# Patient Record
Sex: Female | Born: 2005 | Race: Black or African American | Hispanic: No | Marital: Single | Smoking: Never smoker
Health system: Southern US, Community
[De-identification: ages and names within clinical notes are randomized; demographics above are authoritative.]

## PROBLEM LIST (undated history)

## (undated) DIAGNOSIS — G35 Multiple sclerosis: Secondary | ICD-10-CM

## (undated) DIAGNOSIS — G35D Multiple sclerosis, unspecified: Secondary | ICD-10-CM

## (undated) DIAGNOSIS — M419 Scoliosis, unspecified: Secondary | ICD-10-CM

## (undated) DIAGNOSIS — IMO0002 Reserved for concepts with insufficient information to code with codable children: Secondary | ICD-10-CM

## (undated) HISTORY — PX: NO PAST SURGERIES: SHX2092

## (undated) HISTORY — DX: Scoliosis, unspecified: M41.9

## (undated) HISTORY — DX: Reserved for concepts with insufficient information to code with codable children: IMO0002

---
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
## 2023-01-09 DIAGNOSIS — Z419 Encounter for procedure for purposes other than remedying health state, unspecified: Secondary | ICD-10-CM | POA: Diagnosis not present

## 2023-01-23 ENCOUNTER — Other Ambulatory Visit: Payer: Self-pay | Admitting: Internal Medicine

## 2023-01-23 DIAGNOSIS — Z0000 Encounter for general adult medical examination without abnormal findings: Secondary | ICD-10-CM | POA: Diagnosis not present

## 2023-01-23 DIAGNOSIS — G44229 Chronic tension-type headache, not intractable: Secondary | ICD-10-CM | POA: Diagnosis not present

## 2023-01-23 DIAGNOSIS — E559 Vitamin D deficiency, unspecified: Secondary | ICD-10-CM | POA: Diagnosis not present

## 2023-01-23 DIAGNOSIS — Z13220 Encounter for screening for lipoid disorders: Secondary | ICD-10-CM | POA: Diagnosis not present

## 2023-01-23 DIAGNOSIS — M25552 Pain in left hip: Secondary | ICD-10-CM | POA: Diagnosis not present

## 2023-01-23 DIAGNOSIS — E669 Obesity, unspecified: Secondary | ICD-10-CM | POA: Diagnosis not present

## 2023-01-23 DIAGNOSIS — G379 Demyelinating disease of central nervous system, unspecified: Secondary | ICD-10-CM | POA: Diagnosis not present

## 2023-01-23 DIAGNOSIS — Z131 Encounter for screening for diabetes mellitus: Secondary | ICD-10-CM | POA: Diagnosis not present

## 2023-01-23 DIAGNOSIS — Z6841 Body Mass Index (BMI) 40.0 and over, adult: Secondary | ICD-10-CM | POA: Diagnosis not present

## 2023-01-23 DIAGNOSIS — H6123 Impacted cerumen, bilateral: Secondary | ICD-10-CM | POA: Diagnosis not present

## 2023-01-26 LAB — COMPLETE METABOLIC PANEL WITH GFR
AG Ratio: 1.3 (calc) (ref 1.0–2.5)
ALT: 13 U/L (ref 5–32)
AST: 17 U/L (ref 12–32)
Albumin: 4.3 g/dL (ref 3.6–5.1)
Alkaline phosphatase (APISO): 56 U/L (ref 36–128)
BUN: 8 mg/dL (ref 7–20)
CO2: 16 mmol/L — ABNORMAL LOW (ref 20–32)
Calcium: 9.8 mg/dL (ref 8.9–10.4)
Chloride: 107 mmol/L (ref 98–110)
Creat: 0.65 mg/dL (ref 0.50–0.96)
Globulin: 3.2 g/dL (calc) (ref 2.0–3.8)
Glucose, Bld: 84 mg/dL (ref 65–99)
Potassium: 4.2 mmol/L (ref 3.8–5.1)
Sodium: 139 mmol/L (ref 135–146)
Total Bilirubin: 0.6 mg/dL (ref 0.2–1.1)
Total Protein: 7.5 g/dL (ref 6.3–8.2)
eGFR: 130 mL/min/{1.73_m2} (ref >=60)

## 2023-01-26 LAB — LIPID PANEL
Cholesterol: 188 mg/dL — ABNORMAL HIGH (ref <170)
HDL: 49 mg/dL (ref >45)
LDL Cholesterol (Calc): 122 mg/dL (calc) — ABNORMAL HIGH (ref <110)
Non-HDL Cholesterol (Calc): 139 mg/dL (calc) — ABNORMAL HIGH (ref <120)
Total CHOL/HDL Ratio: 3.8 (calc) (ref <5.0)
Triglycerides: 78 mg/dL (ref <90)

## 2023-01-26 LAB — CBC
HCT: 36 % (ref 35.0–45.0)
Hemoglobin: 11.6 g/dL — ABNORMAL LOW (ref 11.7–15.5)
MCH: 24.8 pg — ABNORMAL LOW (ref 27.0–33.0)
MCHC: 32.2 g/dL (ref 32.0–36.0)
MCV: 76.9 fL — ABNORMAL LOW (ref 80.0–100.0)
MPV: 10.2 fL (ref 7.5–12.5)
Platelets: 401 10*3/uL — ABNORMAL HIGH (ref 140–400)
RBC: 4.68 10*6/uL (ref 3.80–5.10)
RDW: 15.4 % — ABNORMAL HIGH (ref 11.0–15.0)
WBC: 8.8 10*3/uL (ref 3.8–10.8)

## 2023-01-26 LAB — FOLATE: Folate: 15.8 ng/mL

## 2023-01-26 LAB — TSH: TSH: 1.38 mIU/L

## 2023-01-26 LAB — URIC ACID: Uric Acid, Serum: 3.9 mg/dL (ref 2.5–7.0)

## 2023-01-26 LAB — VITAMIN B12: Vitamin B-12: 633 pg/mL (ref 200–1100)

## 2023-01-26 LAB — ANTI-NUCLEAR AB-TITER (ANA TITER)
ANA TITER: 1:40 {titer} — ABNORMAL HIGH
ANA Titer 1: 1:40 {titer} — ABNORMAL HIGH

## 2023-01-26 LAB — SEDIMENTATION RATE: Sed Rate: 31 mm/h — ABNORMAL HIGH (ref 0–20)

## 2023-01-26 LAB — RHEUMATOID FACTOR: Rheumatoid fact SerPl-aCnc: <10 IU/mL (ref <14)

## 2023-01-26 LAB — VITAMIN D 25 HYDROXY (VIT D DEFICIENCY, FRACTURES): Vit D, 25-Hydroxy: 62 ng/mL (ref 30–100)

## 2023-01-26 LAB — ANA: Anti Nuclear Antibody (ANA): POSITIVE — AB

## 2023-01-28 DIAGNOSIS — M25552 Pain in left hip: Secondary | ICD-10-CM | POA: Diagnosis not present

## 2023-02-06 DIAGNOSIS — Z7189 Other specified counseling: Secondary | ICD-10-CM | POA: Diagnosis not present

## 2023-02-06 DIAGNOSIS — G44209 Tension-type headache, unspecified, not intractable: Secondary | ICD-10-CM | POA: Diagnosis not present

## 2023-02-06 DIAGNOSIS — M25552 Pain in left hip: Secondary | ICD-10-CM | POA: Diagnosis not present

## 2023-02-06 DIAGNOSIS — G44229 Chronic tension-type headache, not intractable: Secondary | ICD-10-CM | POA: Diagnosis not present

## 2023-02-06 DIAGNOSIS — Z6839 Body mass index (BMI) 39.0-39.9, adult: Secondary | ICD-10-CM | POA: Diagnosis not present

## 2023-02-09 DIAGNOSIS — Z419 Encounter for procedure for purposes other than remedying health state, unspecified: Secondary | ICD-10-CM | POA: Diagnosis not present

## 2023-02-11 ENCOUNTER — Telehealth: Payer: Self-pay | Admitting: Neurology

## 2023-02-11 NOTE — Telephone Encounter (Signed)
[employer] medicaid Berkley Harvey: [id number] exp. 02/05/2023-04/06/2023 sent to GI [phone]

## 2023-03-05 ENCOUNTER — Encounter: Payer: Self-pay | Admitting: Neurology

## 2023-03-05 ENCOUNTER — Ambulatory Visit (INDEPENDENT_AMBULATORY_CARE_PROVIDER_SITE_OTHER): Payer: Medicaid Other | Admitting: Neurology

## 2023-03-05 VITALS — BP 104/61 | HR 98 | Ht 66.0 in | Wt 237.5 lb

## 2023-03-05 DIAGNOSIS — G379 Demyelinating disease of central nervous system, unspecified: Secondary | ICD-10-CM

## 2023-03-05 DIAGNOSIS — M25552 Pain in left hip: Secondary | ICD-10-CM | POA: Diagnosis not present

## 2023-03-05 DIAGNOSIS — Z6838 Body mass index (BMI) 38.0-38.9, adult: Secondary | ICD-10-CM

## 2023-03-05 DIAGNOSIS — M2550 Pain in unspecified joint: Secondary | ICD-10-CM

## 2023-03-05 DIAGNOSIS — M25551 Pain in right hip: Secondary | ICD-10-CM

## 2023-03-05 MED ORDER — MELOXICAM 7.5 MG PO TABS: 7.5000 mg | ORAL_TABLET | Freq: Every day | ORAL | 11 refills | Status: DC

## 2023-03-05 NOTE — Progress Notes (Signed)
[facility]  PATIENT: Linda Floyd DOB: Jul 03, 2006  REFERRING DOCTOR OR PCP: Bing Neighbors, MD SOURCE: Patient, notes from recent hospitalization, imaging and lab reports, MRI images personally reviewed.  _________________________________   HISTORICAL  CHIEF COMPLAINT:  Chief Complaint  Patient presents with   Room 11    Pt is here Alone. Pt states that things have been going good since last appointment. Pt states that she has been losing weight since last appointment. Pt states that she still has the headaches since last appointment. Pt states that she is having joint pain.     HISTORY OF PRESENT ILLNESS:  Linda Floyd is a 17 y.o. woman with a single demyelinating event  UPDATE 03/05/2023: Denies any additional demyelinating events since her symptoms in early February.  We discussed that the workup including spinal fluid did not show evidence of MS.  However, because of the appearance of the MS lesion (consistent with MS) I feel we need to do some follow-up MRIs.  I discussed this with her in further detail.  Gait is usually fine.  She denies problems with balance though pain in the left greater than right hip is bothersome. Bowel function is fine.  She has urinary urgency at times during the night but not the day.  Vision is fine.    She is otherwise healthy though experiences back pain, joint pain (knees hips and shoulders) and headaches.   Xray of shouldrs and pelvis looked good.   ENA was negative in February 2024.  However, ANA was low positive in May 2024 (both a cytoplasmic and nuclear pattern, both 1: 40).  ESR was mildly elevated.  Mood is fine.   Marland Kitchen  HA's are not associated with N/V.  She sometimes has mild photophobia/phonophobia.   Movements intensify the pain.   Excedrin Migraine has not helped.      Zonisamide has helped  She has lost 25 pounds on phentermine (and zonisamide may also help).   History of demyelinating event She had the onset of a  left leg pulsating sensation while in bed.  When she woke up the left calf and foot were numb.    She wet to the ED and had imaging studies showing a 17 mm focus in the periventricular white matter on the left with some enhancement.    She was placed on IV steroids and initially symptoms worsened - moving to the right as well from the calf down and from the thigh down on the left.   She also has had more muscle spasms in her back.   A few days after discharge, early March 2024, her symptoms resolved.      She has no definite optic neuritis though noted some blurry vision for a few hours.     She noted some clumsiness at work for a few days a week after discharge.     Imaging: MRI of the brain 11/04/2022 shows a single periventricular focus in the left frontal lobe measuring 17 mm in longest diameter (AP).  It had heterogenous enhancement.  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  MRI of the cervical and thoracic spine 11/05/2022 showed no evidence of demyelinating disease in the spinal cord.  Prominent lymph nodes were noted left greater than right  MRI lumbar spine 11/04/2022 was normal   Laboratory: February 2024: CSF showed elevated IgG index but there was 0 oligoclonal bands.  Meningitis/encephalitis panel was negative.   HIV, hCG, ENA, ACE  were negative or noncontributory. Anti-NMO was negative.  Anti-MOG is negative.   ANCA negative  REVIEW OF SYSTEMS: Constitutional: No fevers, chills, sweats, or change in appetite Eyes: No visual changes, double vision, eye pain Ear, nose and throat: No hearing loss, ear pain, nasal congestion, sore throat Cardiovascular: No chest pain, palpitations Respiratory:  No shortness of breath at rest or with exertion.   No wheezes GastrointestinaI: No nausea, vomiting, diarrhea, abdominal pain, fecal incontinence Genitourinary:  No dysuria, urinary retention or frequency.  No nocturia. Musculoskeletal:  No neck pain, back pain Integumentary:  No rash, pruritus, skin lesions Neurological: as above Psychiatric: No depression at this time.  No anxiety Endocrine: No palpitations, diaphoresis, change in appetite, change in weigh or increased thirst Hematologic/Lymphatic:  No anemia, purpura, petechiae. Allergic/Immunologic: No itchy/runny eyes, nasal congestion, recent allergic reactions, rashes  ALLERGIES: No Known Allergies  HOME MEDICATIONS:  Current Outpatient Medications:    meloxicam (MOBIC) 7.5 MG tablet, Take 1 tablet (7.5 mg total) by mouth daily., Disp: 30 tablet, Rfl: 11   zonisamide (ZONEGRAN) 100 MG capsule, Take 1 capsule (100 mg total) by mouth daily., Disp: 30 capsule, Rfl: 5  PAST MEDICAL HISTORY: Past Medical History:  Diagnosis Date   Scoliosis     PAST SURGICAL HISTORY: Past Surgical History:  Procedure Laterality Date   NO PAST SURGERIES      FAMILY HISTORY: Family History  Problem Relation Age of Onset   Anemia Mother    Diabetes Mother    Mental illness Mother    ADD / ADHD Sister    Anemia Maternal Grandmother    Cancer Other        cancers on fathers side-type unknown   Diabetes Maternal Great-grandmother     SOCIAL HISTORY: Social History   Socioeconomic History   Marital status: Single    Spouse name: Not on file   Number of children: 0   Years of education: Not on file   Highest education level: Not on file  Occupational History   Not on file  Tobacco Use   Smoking status: Never   Smokeless tobacco: Never  Vaping Use   Vaping Use: Never used  Substance and Sexual Activity   Alcohol use: Never   Drug use: Never   Sexual activity: Never    Birth control/protection: Abstinence  Other Topics Concern   Not on file  Social History Narrative   Right Handed   Social Determinants of Health   Financial Resource Strain: Low Risk  (11/09/2022)   Overall Financial Resource Strain (CARDIA)    Difficulty of Paying Living Expenses: Not very hard  Food Insecurity: No Food  Insecurity (11/09/2022)   Hunger Vital Sign    Worried About Running Out of Food in the Last Year: Never true    Ran Out of Food in the Last Year: Never true  Transportation Needs: No Transportation Needs (11/09/2022)   PRAPARE - Administrator, Civil Service (Medical): No    Lack of Transportation (Non-Medical): No  Physical Activity: Not on file  Stress: Not on file  Social Connections: Not on file  Intimate Partner Violence: Not At Risk (11/09/2022)   Humiliation, Afraid, Rape, and Kick questionnaire    Fear of Current or Ex-Partner: No    Emotionally Abused: No    Physically Abused: No    Sexually Abused: No       PHYSICAL EXAM  Vitals:   03/05/23 1049  BP: 104/61  Pulse: 98  Weight:  237 lb 8 oz (107.7 kg)  Height: 5\' 6"  (1.676 m)     Body mass index is 38.33 kg/m.   General: The patient is well-developed and well-nourished and in no acute distress  HEENT:  Head is [place]/AT.  Sclera are anicteric.     Neck: No carotid bruits are noted.  Good range of motion.  Skin: Extremities are without rash or  edema.  Musculoskeletal:  Back is nontender  Neurologic Exam  Mental status: The patient is alert and oriented x 3 at the time of the examination. The patient has apparent normal recent and remote memory, with an apparently normal attention span and concentration ability.   Speech is normal.  Cranial nerves: Extraocular movements are full. Pupils are equal, round, and reactive to light and accomodation.  Facial strength and sensation was normal.  No dysarthria.. No obvious hearing deficits are noted.  Motor:  Muscle bulk is normal.   Tone is normal. Strength is  5 / 5 in all 4 extremities.   Sensory: Sensory testing is intact to pinprick, soft touch and vibration sensation in all 4 extremities.  Coordination: Cerebellar testing reveals good finger-nose-finger and heel-to-shin bilaterally.  Gait and station: Station is normal.   Gait is normal. Tandem gait is  normal. Romberg is negative.   Reflexes: Deep tendon reflexes are symmetric and normal bilaterally.   Plantar responses are flexor.    DIAGNOSTIC DATA (LABS, IMAGING, TESTING) - I reviewed patient records, labs, notes, testing and imaging myself where available.  Lab Results  Component Value Date   WBC 8.8 01/23/2023   HGB 11.6 (L) 01/23/2023   HCT 36.0 01/23/2023   MCV 76.9 (L) 01/23/2023   PLT 401 (H) 01/23/2023      Component Value Date/Time   NA 139 01/23/2023 0000   K 4.2 01/23/2023 0000   CL 107 01/23/2023 0000   CO2 16 (L) 01/23/2023 0000   GLUCOSE 84 01/23/2023 0000   BUN 8 01/23/2023 0000   CREATININE 0.65 01/23/2023 0000   CALCIUM 9.8 01/23/2023 0000   PROT 7.5 01/23/2023 0000   ALBUMIN 4.2 11/06/2022 1442   AST 17 01/23/2023 0000   ALT 13 01/23/2023 0000   BILITOT 0.6 01/23/2023 0000   GFRNONAA >60 11/07/2022 0722   Lab Results  Component Value Date   CHOL 188 (H) 01/23/2023   HDL 49 01/23/2023   LDLCALC 122 (H) 01/23/2023   TRIG 78 01/23/2023   CHOLHDL 3.8 01/23/2023   Lab Results  Component Value Date   HGBA1C 5.5 04/05/2022   Lab Results  Component Value Date   VITAMINB12 633 01/23/2023   Lab Results  Component Value Date   TSH 1.38 01/23/2023       ASSESSMENT AND PLAN  Demyelinating changes in brain [facility]) - Plan: MR BRAIN W WO CONTRAST  Polyarthralgia  Bilateral hip pain  BMI 38.0-38.9,adult   MRI brain to determine if breakthrough activity is occurring.  If so, consider a DMT Continue zonisamide for migraine Meloxicam 7.5 mg for joint pain She as joint pain and ANA was low positive (cytoplasmic).   If symptoms worsen, despite meloxicam, advosed to discuss further with PCP - may need rheum referral.   Rtc 13 months   Korynn Kenedy A. Epimenio Foot, MD, [facility] 03/05/2023, 11:16 AM Certified in Neurology, Clinical Neurophysiology, Sleep Medicine and Neuroimaging  [facility] [address], [address] [place], [place]  [address] [phone]

## 2023-03-11 DIAGNOSIS — Z419 Encounter for procedure for purposes other than remedying health state, unspecified: Secondary | ICD-10-CM | POA: Diagnosis not present

## 2023-04-03 DIAGNOSIS — Z6836 Body mass index (BMI) 36.0-36.9, adult: Secondary | ICD-10-CM | POA: Diagnosis not present

## 2023-04-03 DIAGNOSIS — H6121 Impacted cerumen, right ear: Secondary | ICD-10-CM | POA: Diagnosis not present

## 2023-04-03 DIAGNOSIS — Z7189 Other specified counseling: Secondary | ICD-10-CM | POA: Diagnosis not present

## 2023-04-03 DIAGNOSIS — G379 Demyelinating disease of central nervous system, unspecified: Secondary | ICD-10-CM | POA: Diagnosis not present

## 2023-04-03 DIAGNOSIS — M25552 Pain in left hip: Secondary | ICD-10-CM | POA: Diagnosis not present

## 2023-04-03 DIAGNOSIS — F4323 Adjustment disorder with mixed anxiety and depressed mood: Secondary | ICD-10-CM | POA: Diagnosis not present

## 2023-04-03 DIAGNOSIS — L700 Acne vulgaris: Secondary | ICD-10-CM | POA: Diagnosis not present

## 2023-04-09 NOTE — Telephone Encounter (Signed)
[employer] medicaid Berkley Harvey: [id number] exp. 04/08/23-06/07/23 sent to GI [phone]

## 2023-04-11 DIAGNOSIS — Z419 Encounter for procedure for purposes other than remedying health state, unspecified: Secondary | ICD-10-CM | POA: Diagnosis not present

## 2023-05-12 DIAGNOSIS — Z419 Encounter for procedure for purposes other than remedying health state, unspecified: Secondary | ICD-10-CM | POA: Diagnosis not present

## 2023-05-16 DIAGNOSIS — Z7189 Other specified counseling: Secondary | ICD-10-CM | POA: Diagnosis not present

## 2023-05-16 DIAGNOSIS — G379 Demyelinating disease of central nervous system, unspecified: Secondary | ICD-10-CM | POA: Diagnosis not present

## 2023-05-16 DIAGNOSIS — N925 Other specified irregular menstruation: Secondary | ICD-10-CM | POA: Diagnosis not present

## 2023-05-16 DIAGNOSIS — G44229 Chronic tension-type headache, not intractable: Secondary | ICD-10-CM | POA: Diagnosis not present

## 2023-05-16 DIAGNOSIS — Z6834 Body mass index (BMI) 34.0-34.9, adult: Secondary | ICD-10-CM | POA: Diagnosis not present

## 2023-05-20 DIAGNOSIS — L700 Acne vulgaris: Secondary | ICD-10-CM | POA: Diagnosis not present

## 2023-05-20 DIAGNOSIS — L83 Acanthosis nigricans: Secondary | ICD-10-CM | POA: Diagnosis not present

## 2023-05-20 DIAGNOSIS — L819 Disorder of pigmentation, unspecified: Secondary | ICD-10-CM | POA: Diagnosis not present

## 2023-05-21 DIAGNOSIS — N926 Irregular menstruation, unspecified: Secondary | ICD-10-CM | POA: Diagnosis not present

## 2023-05-22 ENCOUNTER — Ambulatory Visit (INDEPENDENT_AMBULATORY_CARE_PROVIDER_SITE_OTHER): Payer: Self-pay | Admitting: Primary Care

## 2023-05-29 DIAGNOSIS — F411 Generalized anxiety disorder: Secondary | ICD-10-CM | POA: Diagnosis not present

## 2023-05-29 DIAGNOSIS — F331 Major depressive disorder, recurrent, moderate: Secondary | ICD-10-CM | POA: Diagnosis not present

## 2023-06-05 NOTE — Telephone Encounter (Signed)
[employer] medicaid Berkley Harvey: [id number] exp. 06/05/23-08/04/23

## 2023-06-09 ENCOUNTER — Ambulatory Visit
Admission: RE | Admit: 2023-06-09 | Discharge: 2023-06-09 | Disposition: A | Discharge status: Home / Self Care | Payer: Medicaid Other | Source: Ambulatory Visit | Attending: Neurology | Admitting: Neurology

## 2023-06-09 DIAGNOSIS — G379 Demyelinating disease of central nervous system, unspecified: Secondary | ICD-10-CM

## 2023-06-09 MED ORDER — GADOPICLENOL 0.5 MMOL/ML IV SOLN
10.0000 mL | Freq: Once | INTRAVENOUS | Status: AC | PRN
  Administered 2023-06-09: 10 mL via INTRAVENOUS

## 2023-06-10 ENCOUNTER — Telehealth: Payer: Self-pay | Admitting: Neurology

## 2023-06-10 NOTE — Telephone Encounter (Signed)
I spoke to Epiphany about the MRI findings.  The MRI shows that the enhancement of the left frontal lesion has resolved.  However, since the February 2024 MRI there are about 3 new lesions.  They are all fairly small and would probably have been asymptomatic.  However, this change over time makes the diagnosis of MS more likely and I would recommend that we begin a disease modifying therapy.  I would like her to come into the office in the next week or 2 so that we can discuss the DMT options and get her started.

## 2023-06-11 DIAGNOSIS — Z419 Encounter for procedure for purposes other than remedying health state, unspecified: Secondary | ICD-10-CM | POA: Diagnosis not present

## 2023-06-11 NOTE — Telephone Encounter (Signed)
Spoke w/ Dr. Epimenio Foot. Ok to use MSNP slot next week

## 2023-06-11 NOTE — Telephone Encounter (Signed)
Called pt. Scheduled appt for 06/20/23 at 1:30pm, check in 1:00pm. Pt verbalized understanding and appreciation.

## 2023-06-18 DIAGNOSIS — F331 Major depressive disorder, recurrent, moderate: Secondary | ICD-10-CM | POA: Diagnosis not present

## 2023-06-18 DIAGNOSIS — F411 Generalized anxiety disorder: Secondary | ICD-10-CM | POA: Diagnosis not present

## 2023-06-20 ENCOUNTER — Telehealth: Payer: Self-pay | Admitting: *Deleted

## 2023-06-20 ENCOUNTER — Encounter: Payer: Self-pay | Admitting: Neurology

## 2023-06-20 ENCOUNTER — Ambulatory Visit (INDEPENDENT_AMBULATORY_CARE_PROVIDER_SITE_OTHER): Payer: Medicaid Other | Admitting: Neurology

## 2023-06-20 ENCOUNTER — Ambulatory Visit: Payer: Medicaid Other | Admitting: Neurology

## 2023-06-20 VITALS — BP 112/73 | HR 91 | Ht 66.0 in | Wt 208.0 lb

## 2023-06-20 DIAGNOSIS — G35 Multiple sclerosis: Secondary | ICD-10-CM | POA: Diagnosis not present

## 2023-06-20 DIAGNOSIS — G379 Demyelinating disease of central nervous system, unspecified: Secondary | ICD-10-CM | POA: Diagnosis not present

## 2023-06-20 DIAGNOSIS — R200 Anesthesia of skin: Secondary | ICD-10-CM

## 2023-06-20 DIAGNOSIS — N925 Other specified irregular menstruation: Secondary | ICD-10-CM | POA: Diagnosis not present

## 2023-06-20 DIAGNOSIS — G44229 Chronic tension-type headache, not intractable: Secondary | ICD-10-CM | POA: Diagnosis not present

## 2023-06-20 DIAGNOSIS — M2550 Pain in unspecified joint: Secondary | ICD-10-CM | POA: Diagnosis not present

## 2023-06-20 DIAGNOSIS — E559 Vitamin D deficiency, unspecified: Secondary | ICD-10-CM

## 2023-06-20 MED ORDER — ESCITALOPRAM OXALATE 10 MG PO TABS: 10.0000 mg | ORAL_TABLET | Freq: Every day | ORAL | 3 refills | Status: DC

## 2023-06-20 MED ORDER — ZONISAMIDE 100 MG PO CAPS: 100.0000 mg | ORAL_CAPSULE | Freq: Every day | ORAL | 3 refills | Status: DC

## 2023-06-20 NOTE — Progress Notes (Signed)
[facility]  PATIENT: Arletha Marschke DOB: 2006/03/25  REFERRING DOCTOR OR PCP: Bing Neighbors, MD SOURCE: Patient, notes from recent hospitalization, imaging and lab reports, MRI images personally reviewed.  _________________________________   HISTORICAL  CHIEF COMPLAINT:  Chief Complaint  Patient presents with   Room 10    Pt is here Alone. Pt states that she is having tremors. Pt states that she would like to discuss something for anxiety. Pt is being active and working out.     HISTORY OF PRESENT ILLNESS:  Briann Sarchet is a 17 y.o. woman with newly diagnosed RRMS.  UPDATE 06/19/2023: Since last visit, an MRI of the brain was performed 06/09/2023.  It showed resolution of the enhancement associated with the left frontal focus seen on the previous MRI.  However, in the interim (11/04/2022) there are 3 additional foci in the hemispheres.  These are small and would probably not have cause any symptoms.  However, the changes from the first to the second MRI show dissemination in time consistent with multiple sclerosis.  Therefore, I would like to get her started on a disease modifying therapy.  We had a discussion about therapy options and discussed pros and cons of different treatment as well as affect and family planning.  For those reasons, she was most interested in Copaxone/glatiramer.  She has not had any exacerbations since her symptoms in early February.  We discussed that the workup including spinal fluid did not show evidence of MS.  However, because of the appearance of the MS lesion (consistent with MS) I feel we need to do some follow-up MRIs.  I discussed this with her in further detail.  Gait is fine.  She can go up and down stairs without the banister.. Bowel function is fine.  She has urinary urgency at times during the night but not the day.  Vision is fine.    She is otherwise healthy though experiences back pain, joint pain (knees hips and shoulders) and  headaches.   Xray of shouldrs and pelvis looked good.   ENA was negative in February 2024.  However, ANA was low positive in May 2024 (both a cytoplasmic and nuclear pattern, both 1: 40).  ESR was mildly elevated.  Since the diagnosis, she has noted quite a bit more anxiety.  She denies actual depression..   Lodema Pilot are not associated with N/V.  She sometimes has mild photophobia/phonophobia.   Movements intensify the pain.   Excedrin Migraine has not helped.      Zonisamide helped but she stopped when the prescription ran out and has not refilled.  She has lost 25 pounds on phentermine (and zonisamide may also help).   History of demyelinating event She had the onset of a left leg pulsating sensation while in bed.  When she woke up the left calf and foot were numb.    She wet to the ED and had imaging studies showing a 17 mm focus in the periventricular white matter on the left with some enhancement.    She was placed on IV steroids and initially symptoms worsened - moving to the right as well from the calf down and from the thigh down on the left.   She also has had more muscle spasms in her back.   A few days after discharge, early March 2024, her symptoms resolved.      She has no definite optic neuritis though noted some blurry vision for a few hours.  She noted some clumsiness at work for a few days a week after discharge.     Imaging: MRI of the brain 11/04/2022 shows a single periventricular focus in the left frontal lobe measuring 17 mm in longest diameter (AP).  It had heterogenous enhancement.  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  MRI of the cervical and thoracic spine 11/05/2022 showed no evidence of demyelinating disease in the spinal cord.  Prominent lymph nodes were noted left greater than right  MRI lumbar spine 11/04/2022 was normal   Laboratory: February 2024: CSF showed elevated IgG index but there was 0 oligoclonal bands.  Meningitis/encephalitis panel  was negative.   HIV, hCG, ENA, ACE were negative or noncontributory. Anti-NMO was negative.  Anti-MOG is negative.   ANCA negative  REVIEW OF SYSTEMS: Constitutional: No fevers, chills, sweats, or change in appetite Eyes: No visual changes, double vision, eye pain Ear, nose and throat: No hearing loss, ear pain, nasal congestion, sore throat Cardiovascular: No chest pain, palpitations Respiratory:  No shortness of breath at rest or with exertion.   No wheezes GastrointestinaI: No nausea, vomiting, diarrhea, abdominal pain, fecal incontinence Genitourinary:  No dysuria, urinary retention or frequency.  No nocturia. Musculoskeletal:  No neck pain, back pain Integumentary: No rash, pruritus, skin lesions Neurological: as above Psychiatric: No depression at this time.  No anxiety Endocrine: No palpitations, diaphoresis, change in appetite, change in weigh or increased thirst Hematologic/Lymphatic:  No anemia, purpura, petechiae. Allergic/Immunologic: No itchy/runny eyes, nasal congestion, recent allergic reactions, rashes  ALLERGIES: No Known Allergies  HOME MEDICATIONS:  Current Outpatient Medications:    escitalopram (LEXAPRO) 10 MG tablet, Take 1 tablet (10 mg total) by mouth daily., Disp: 90 tablet, Rfl: 3   meloxicam (MOBIC) 7.5 MG tablet, Take 1 tablet (7.5 mg total) by mouth daily., Disp: 30 tablet, Rfl: 11   zonisamide (ZONEGRAN) 100 MG capsule, Take 1 capsule (100 mg total) by mouth daily., Disp: 90 capsule, Rfl: 3  PAST MEDICAL HISTORY: Past Medical History:  Diagnosis Date   Scoliosis     PAST SURGICAL HISTORY: Past Surgical History:  Procedure Laterality Date   NO PAST SURGERIES      FAMILY HISTORY: Family History  Problem Relation Age of Onset   Anemia Mother    Diabetes Mother    Mental illness Mother    ADD / ADHD Sister    Anemia Maternal Grandmother    Cancer Other        cancers on fathers side-type unknown   Diabetes Maternal Great-grandmother      SOCIAL HISTORY: Social History   Socioeconomic History   Marital status: Single    Spouse name: Not on file   Number of children: 0   Years of education: Not on file   Highest education level: Not on file  Occupational History   Not on file  Tobacco Use   Smoking status: Never   Smokeless tobacco: Never  Vaping Use   Vaping status: Never Used  Substance and Sexual Activity   Alcohol use: Never   Drug use: Never   Sexual activity: Never    Birth control/protection: Abstinence  Other Topics Concern   Not on file  Social History Narrative   Right Handed   Social Determinants of Health   Financial Resource Strain: Low Risk  (11/09/2022)   Overall Financial Resource Strain (CARDIA)    Difficulty of Paying Living Expenses: Not very hard  Food Insecurity: No Food Insecurity (11/09/2022)  Hunger Vital Sign    Worried About Running Out of Food in the Last Year: Never true    Ran Out of Food in the Last Year: Never true  Transportation Needs: No Transportation Needs (11/09/2022)   PRAPARE - Administrator, Civil Service (Medical): No    Lack of Transportation (Non-Medical): No  Physical Activity: Not on file  Stress: Not on file  Social Connections: Not on file  Intimate Partner Violence: Not At Risk (11/09/2022)   Humiliation, Afraid, Rape, and Kick questionnaire    Fear of Current or Ex-Partner: No    Emotionally Abused: No    Physically Abused: No    Sexually Abused: No       PHYSICAL EXAM  Vitals:   06/20/23 0827  BP: 112/73  Pulse: 91  Weight: 208 lb (94.3 kg)  Height: 5\' 6"  (1.676 m)     Body mass index is 33.57 kg/m.   General: The patient is well-developed and well-nourished and in no acute distress  HEENT:  Head is [place]/AT.  Sclera are anicteric.     Neck: No carotid bruits are noted.  Good range of motion.  Skin: Extremities are without rash or  edema.  Musculoskeletal:  Back is nontender  Neurologic Exam  Mental status: The  patient is alert and oriented x 3 at the time of the examination. The patient has apparent normal recent and remote memory, with an apparently normal attention span and concentration ability.   Speech is normal.  Cranial nerves: Extraocular movements are full. Pupils are equal, round, and reactive to light and accomodation.  Facial strength and sensation was normal.  No dysarthria.. No obvious hearing deficits are noted.  Motor:  Muscle bulk is normal.   Tone is normal. Strength is  5 / 5 in all 4 extremities.   Sensory: Sensory testing is intact to pinprick, soft touch and vibration sensation in all 4 extremities.  Coordination: Cerebellar testing reveals good finger-nose-finger and heel-to-shin bilaterally.  Gait and station: Station is normal.   The gait and tandem gait are normal.. Romberg is negative.   Reflexes: Deep tendon reflexes are symmetric and normal bilaterally.   Plantar responses are flexor.    DIAGNOSTIC DATA (LABS, IMAGING, TESTING) - I reviewed patient records, labs, notes, testing and imaging myself where available.  Lab Results  Component Value Date   WBC 8.8 01/23/2023   HGB 11.6 (L) 01/23/2023   HCT 36.0 01/23/2023   MCV 76.9 (L) 01/23/2023   PLT 401 (H) 01/23/2023      Component Value Date/Time   NA 139 01/23/2023 0000   K 4.2 01/23/2023 0000   CL 107 01/23/2023 0000   CO2 16 (L) 01/23/2023 0000   GLUCOSE 84 01/23/2023 0000   BUN 8 01/23/2023 0000   CREATININE 0.65 01/23/2023 0000   CALCIUM 9.8 01/23/2023 0000   PROT 7.5 01/23/2023 0000   ALBUMIN 4.2 11/06/2022 1442   AST 17 01/23/2023 0000   ALT 13 01/23/2023 0000   BILITOT 0.6 01/23/2023 0000   GFRNONAA >60 11/07/2022 0722   Lab Results  Component Value Date   CHOL 188 (H) 01/23/2023   HDL 49 01/23/2023   LDLCALC 122 (H) 01/23/2023   TRIG 78 01/23/2023   CHOLHDL 3.8 01/23/2023   Lab Results  Component Value Date   HGBA1C 5.5 04/05/2022   Lab Results  Component Value Date   VITAMINB12  633 01/23/2023   Lab Results  Component Value Date   TSH  1.38 01/23/2023       ASSESSMENT AND PLAN  Multiple sclerosis (HCC)  Polyarthralgia  Numbness  Chronic tension-type headache, not intractable  Vitamin D deficiency   The MRI of the brain showed additional white matter foci.  She now meets McDonald criteria for multiple sclerosis.  Therefore, beginning a disease modifying therapy is appropriate.  We went over options.  For potential future family-planning issues, she was most interested in glatiramer.  She signed the service request form and we will try to get her started.  We went over the risks and benefits.  We also discussed that a backup plan would be dimethyl fumarate if she has significant skin reactions.  Sometime in 2025 we will check an MRI of the brain.  If she has had further progression we will need to consider a more efficacious disease modifying therapy.   Restart zonisamide for migraine Meloxicam 7.5 mg for joint pain.   She as joint pain and ANA was low positive (cytoplasmic).   If symptoms worsen, despite meloxicam, advosed to discuss further with PCP - may need rheum referral.   S-Citalopram 10 mg for anxiety. Rtc 6 months   Marlo Goodrich A. Epimenio Foot, MD, [facility] 06/20/2023, 9:32 AM Certified in Neurology, Clinical Neurophysiology, Sleep Medicine and Neuroimaging  [facility] [address], [address] [place], [place] [address] [phone]

## 2023-06-20 NOTE — Telephone Encounter (Signed)
  Faxed to [phone] confirmation received.

## 2023-07-02 NOTE — Telephone Encounter (Signed)
Attempted PA for glatiramer 40mg  via [facility]. Sent to [facility]. Key: [id number]. Should have a determination within 3-5 business days.

## 2023-07-04 NOTE — Telephone Encounter (Signed)
I called patient to inform her that the glatiramer injection does not need a PA.  She should have received calls from [employer] already.  No answer, left a voicemail asking her to call us back.  If patient calls back, please advise her that her glatiramer did not need prior authorization through her insurance.  She should have received calls from [employer] already.  Please ask her if she has received her glatiramer yet.  If not, please give her [facility]'s phone number of [phone] and encouraged her to call them.

## 2023-07-10 NOTE — Telephone Encounter (Signed)
Patient called, have not receive injection in the mail. Informed patient of previous message and relayed to her the phone number for  [employer].

## 2023-07-11 NOTE — Telephone Encounter (Signed)
Took call from phone staff and spoke with Payal from [employer]. Confirmed we have not sent rx to specialty pharmacy. Advised we have them typically handle this. They verbalized understanding and will coordinate this for pt, nothing further needed.

## 2023-07-12 DIAGNOSIS — Z419 Encounter for procedure for purposes other than remedying health state, unspecified: Secondary | ICD-10-CM | POA: Diagnosis not present

## 2023-07-22 NOTE — Telephone Encounter (Signed)
[facility] Clydie Braun) caliing check on the status PA. Sent over CoverMyMeds keys phone number to start the plan.[phone]  Can contact at [phone]

## 2023-07-22 NOTE — Telephone Encounter (Signed)
Called [facility] back. Spoke w/ pharm tech. She blind [facility]. Spoke w/ Katie.  States pt has dedicated team and transferred me. Asked to speak with pharmacy. Unable to get rep on the phone.  NO PA needed per letter below.

## 2023-07-22 NOTE — Telephone Encounter (Signed)
Called [facility] back and spoke w/ Crystal in the pharmacy. Relayed info from approval letter. [employer]DC covered is for brand name Copaxone [phone]). I provided VO to switch to this instead (DAW). Asked they also dispense Autoject 2 for glass syringe injection device. She will confirm with pharmacy to see if they can get this device. They will call back if unable to dispense this device.

## 2023-07-25 NOTE — Telephone Encounter (Signed)
I called [facility]. Copaxone has been shipped on11/13/2024.TBD 07/26/23 to patient's address.

## 2023-07-29 NOTE — Telephone Encounter (Signed)
I called patient to discuss if she has received and started glatiramer.  No answer, voicemail has not been set up yet.  I will continue to follow.

## 2023-08-01 DIAGNOSIS — Z6832 Body mass index (BMI) 32.0-32.9, adult: Secondary | ICD-10-CM | POA: Diagnosis not present

## 2023-08-01 DIAGNOSIS — Z7189 Other specified counseling: Secondary | ICD-10-CM | POA: Diagnosis not present

## 2023-08-01 DIAGNOSIS — N925 Other specified irregular menstruation: Secondary | ICD-10-CM | POA: Diagnosis not present

## 2023-08-01 DIAGNOSIS — G4700 Insomnia, unspecified: Secondary | ICD-10-CM | POA: Diagnosis not present

## 2023-08-07 ENCOUNTER — Ambulatory Visit: Payer: Medicaid Other | Admitting: Registered"

## 2023-08-10 ENCOUNTER — Encounter (HOSPITAL_COMMUNITY): Payer: Self-pay

## 2023-08-10 ENCOUNTER — Ambulatory Visit (HOSPITAL_COMMUNITY)
Admission: EM | Admit: 2023-08-10 | Discharge: 2023-08-10 | Disposition: A | Discharge status: Home / Self Care | Payer: Medicaid Other | Attending: Family Medicine | Admitting: Family Medicine

## 2023-08-10 DIAGNOSIS — R5383 Other fatigue: Secondary | ICD-10-CM | POA: Diagnosis not present

## 2023-08-10 HISTORY — DX: Multiple sclerosis, unspecified: G35.D

## 2023-08-10 HISTORY — DX: Multiple sclerosis: G35

## 2023-08-10 LAB — CBC WITH DIFFERENTIAL/PLATELET
Abs Immature Granulocytes: 0.02 10*3/uL (ref 0.00–0.07)
Basophils Absolute: 0.1 10*3/uL (ref 0.0–0.1)
Basophils Relative: 1 %
Eosinophils Absolute: 0.2 10*3/uL (ref 0.0–0.5)
Eosinophils Relative: 2 %
HCT: 35.5 % — ABNORMAL LOW (ref 36.0–46.0)
Hemoglobin: 11.6 g/dL — ABNORMAL LOW (ref 12.0–15.0)
Immature Granulocytes: 0 %
Lymphocytes Relative: 32 %
Lymphs Abs: 2.9 10*3/uL (ref 0.7–4.0)
MCH: 26.9 pg (ref 26.0–34.0)
MCHC: 32.7 g/dL (ref 30.0–36.0)
MCV: 82.4 fL (ref 80.0–100.0)
Monocytes Absolute: 0.6 10*3/uL (ref 0.1–1.0)
Monocytes Relative: 6 %
Neutro Abs: 5.4 10*3/uL (ref 1.7–7.7)
Neutrophils Relative %: 59 %
Platelets: 333 10*3/uL (ref 150–400)
RBC: 4.31 MIL/uL (ref 3.87–5.11)
RDW: 14.3 % (ref 11.5–15.5)
WBC: 9.1 10*3/uL (ref 4.0–10.5)
nRBC: 0 % (ref 0.0–0.2)

## 2023-08-10 LAB — COMPREHENSIVE METABOLIC PANEL
ALT: 22 U/L (ref 0–44)
AST: 20 U/L (ref 15–41)
Albumin: 4.1 g/dL (ref 3.5–5.0)
Alkaline Phosphatase: 43 U/L (ref 38–126)
Anion gap: 7 (ref 5–15)
BUN: 14 mg/dL (ref 6–20)
CO2: 25 mmol/L (ref 22–32)
Calcium: 9.4 mg/dL (ref 8.9–10.3)
Chloride: 104 mmol/L (ref 98–111)
Creatinine, Ser: 0.72 mg/dL (ref 0.44–1.00)
GFR, Estimated: >60 mL/min (ref >60)
Glucose, Bld: 94 mg/dL (ref 70–99)
Potassium: 3.7 mmol/L (ref 3.5–5.1)
Sodium: 136 mmol/L (ref 135–145)
Total Bilirubin: 0.7 mg/dL (ref <1.2)
Total Protein: 7.4 g/dL (ref 6.5–8.1)

## 2023-08-10 LAB — TSH: TSH: 1.116 u[IU]/mL (ref 0.350–4.500)

## 2023-08-10 LAB — LIPASE, BLOOD: Lipase: 27 U/L (ref 11–51)

## 2023-08-10 NOTE — Discharge Instructions (Signed)
You have had labs (blood work) sent today. We will call you with any significant abnormalities or if there is need to begin or change treatment or pursue further follow up.  You may also review your test results online through [facility]. If you do not have a MyChart account, instructions to sign up should be on your discharge paperwork.

## 2023-08-10 NOTE — ED Triage Notes (Signed)
Pt presents to the office for fatigue, altered mental status and yellow eyes. Pt states her symptoms started 2 weeks ago after starting a new medication called Copaxone for MS. Patient states when she wakes up in the morning her eyes are yellow.

## 2023-08-11 DIAGNOSIS — Z419 Encounter for procedure for purposes other than remedying health state, unspecified: Secondary | ICD-10-CM | POA: Diagnosis not present

## 2023-08-12 NOTE — ED Provider Notes (Signed)
[facility]   [id number] 08/10/23 Arrival Time: 1559  ASSESSMENT & PLAN:  1. Other fatigue    She requests blood work.  Pending:     CBC with Differential/Platelet   Comprehensive metabolic panel   Lipase, blood   TSH   Appears well. Will work on getting in to see her neurologist asap.    Follow-up Information     [facility].   Specialty: Urgent Care Why: As needed. Contact information: [address] [place] [address] [phone]                Reviewed expectations re: course of current medical issues. Questions answered. Outlined signs and symptoms indicating need for more acute intervention. Understanding verbalized. After Visit Summary given.   SUBJECTIVE: History from: Patient. Linda Floyd is a 17 y.o. female. Pt presents with CC of fatigue. Approx two weeks. Wondering if new medication Copaxone is contributing. Reports "yellow eyes" in the mornings after taking mediciaton. Followed by neurology for questionable MS. Denies: fever and abd pain. Normal PO intake without n/v/d. Patient's last menstrual period was 08/10/2023 (approximate).   OBJECTIVE:  Vitals:   08/10/23 1652  BP: 114/68  Pulse: 90  Resp: 18  Temp: 98.5 F (36.9 C)  TempSrc: Oral  SpO2: 97%    General appearance: alert; no distress Eyes: PERRLA; EOMI; conjunctiva normal HENT: [place]; AT Neck: supple  Lungs: speaks full sentences without difficulty; unlabored Skin: warm and dry Neurologic: normal gait Psychological: alert and cooperative; normal mood and affect  No Known Allergies  Past Medical History:  Diagnosis Date   MS (multiple sclerosis) (HCC)    Scoliosis    Social History   Socioeconomic History   Marital status: Single    Spouse name: Not on file   Number of children: 0   Years of education: Not on file   Highest education level: Not on file  Occupational History   Not on file  Tobacco Use    Smoking status: Never   Smokeless tobacco: Never  Vaping Use   Vaping status: Never Used  Substance and Sexual Activity   Alcohol use: Never   Drug use: Never   Sexual activity: Never    Birth control/protection: Abstinence  Other Topics Concern   Not on file  Social History Narrative   Right Handed   Social Determinants of Health   Financial Resource Strain: Low Risk  (11/09/2022)   Overall Financial Resource Strain (CARDIA)    Difficulty of Paying Living Expenses: Not very hard  Food Insecurity: No Food Insecurity (11/09/2022)   Hunger Vital Sign    Worried About Running Out of Food in the Last Year: Never true    Ran Out of Food in the Last Year: Never true  Transportation Needs: No Transportation Needs (11/09/2022)   PRAPARE - Administrator, Civil Service (Medical): No    Lack of Transportation (Non-Medical): No  Physical Activity: Not on file  Stress: Not on file  Social Connections: Not on file  Intimate Partner Violence: Not At Risk (11/09/2022)   Humiliation, Afraid, Rape, and Kick questionnaire    Fear of Current or Ex-Partner: No    Emotionally Abused: No    Physically Abused: No    Sexually Abused: No   Family History  Problem Relation Age of Onset   Anemia Mother    Diabetes Mother    Mental illness Mother    ADD / ADHD Sister  Anemia Maternal Grandmother    Cancer Other        cancers on fathers side-type unknown   Diabetes Maternal Great-grandmother    Past Surgical History:  Procedure Laterality Date   NO PAST SURGERIES       Mardella Layman, MD 08/12/23 [phone]

## 2023-08-15 NOTE — Telephone Encounter (Signed)
I called patient again to discuss if she has received and started glatiramer. No answer, voicemail has not been set up yet. I will try again another time.

## 2023-08-29 DIAGNOSIS — G4700 Insomnia, unspecified: Secondary | ICD-10-CM | POA: Diagnosis not present

## 2023-09-11 DIAGNOSIS — Z419 Encounter for procedure for purposes other than remedying health state, unspecified: Secondary | ICD-10-CM | POA: Diagnosis not present

## 2023-09-17 NOTE — Telephone Encounter (Signed)
 I called patient again to discuss if she has received and started Copaxone.  No answer, voicemail has not been set up yet.  She has been unresponsive to phone calls and MyChart messages.  I will mail her a letter asking her to call us back.

## 2023-10-12 DIAGNOSIS — Z419 Encounter for procedure for purposes other than remedying health state, unspecified: Secondary | ICD-10-CM | POA: Diagnosis not present

## 2023-11-09 DIAGNOSIS — Z419 Encounter for procedure for purposes other than remedying health state, unspecified: Secondary | ICD-10-CM | POA: Diagnosis not present

## 2023-12-21 DIAGNOSIS — Z419 Encounter for procedure for purposes other than remedying health state, unspecified: Secondary | ICD-10-CM | POA: Diagnosis not present

## 2023-12-24 ENCOUNTER — Encounter: Payer: Self-pay | Admitting: Neurology

## 2023-12-24 ENCOUNTER — Ambulatory Visit (INDEPENDENT_AMBULATORY_CARE_PROVIDER_SITE_OTHER): Payer: Medicaid Other | Admitting: Neurology

## 2023-12-24 VITALS — BP 109/68 | HR 85 | Ht 65.0 in | Wt 195.0 lb

## 2023-12-24 DIAGNOSIS — G35 Multiple sclerosis: Secondary | ICD-10-CM

## 2023-12-24 DIAGNOSIS — M2550 Pain in unspecified joint: Secondary | ICD-10-CM

## 2023-12-24 DIAGNOSIS — G44229 Chronic tension-type headache, not intractable: Secondary | ICD-10-CM

## 2023-12-24 DIAGNOSIS — R200 Anesthesia of skin: Secondary | ICD-10-CM

## 2023-12-24 NOTE — Progress Notes (Signed)
 [facility]  PATIENT: Linda Floyd DOB: [birth year]  REFERRING DOCTOR OR PCP: Bing Neighbors, MD SOURCE: Patient, notes from recent hospitalization, imaging and lab reports, MRI images personally reviewed.  _________________________________   HISTORICAL  CHIEF COMPLAINT:  Chief Complaint  Patient presents with   Follow-up    Pt in 10 alone Pt here MS f/u Pt states numbness,tingling,burning in whole body Pt states eyes and finger twitch Pt states increased headaches . Pt hasn't had eye appointment since elementary school Pt states forgetful     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  UPDATE 12/24/2023: She started glatiramer around 06/2023.  She is tolerating it well systemically but has skin reactions.   She notes no definite exacerbation but notes some mild cognitive issues , aches/pains and numbness.   She notes some fluctuating visual changes but n change in color vision.    The MRI of the brain performed 06/09/2023 showed resolution of the enhancement associated with the left frontal focus seen on the previous MRI.  However, in the interim (since 11/04/2022) there are 3 additional foci in the hemispheres.  These are small and would probably not have cause any symptoms.      Gait is fine.  She can go up and down stairs without the banister.. She sometimes goes to a gym.  Bowel function is fine.  She has urinary urgency but no incontinence  Vision is fine.    She still experiences back pain, joint pain (knees hips and shoulders) and headaches.   Xray of shouldrs and pelvis looked good.   ENA was negative in February 2024.  However, ANA was low positive in May 2024 (both a cytoplasmic and nuclear pattern, both 1: 40).  ESR was mildly elevated.  Since the diagnosis, she has noted quite a bit more anxiety.  She denies actual depression..   .    She has migraines.    These are not associated with N/V.  She sometimes has mild  photophobia/phonophobia.   Movements intensify the pain.   Excedrin Migraine has not helped.    She tried zonisamide and is may have helped some but did not eliminate them. .  History of demyelinating event She had the onset of a left leg pulsating sensation while in bed.  When she woke up the left calf and foot were numb.    She wet to the ED and had imaging studies showing a 17 mm focus in the periventricular white matter on the left with some enhancement.    She was placed on IV steroids and initially symptoms worsened - moving to the right as well from the calf down and from the thigh down on the left.   She also has had more muscle spasms in her back.   A few days after discharge, early March 2024, her symptoms resolved.      She has no definite optic neuritis though noted some blurry vision for a few hours.     She noted some clumsiness at work for a few days a week after discharge.     Imaging: MRI of the brain 11/04/2022 shows a single periventricular focus in the left frontal lobe measuring 17 mm in longest diameter (AP).  It had heterogenous enhancement.  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  MRI of the cervical and thoracic spine 11/05/2022 showed no evidence of demyelinating disease in the spinal cord.  Prominent  lymph nodes were noted left greater than right  MRI lumbar spine 11/04/2022 was normal   Laboratory: February 2024: CSF showed elevated IgG index but there was 0 oligoclonal bands.  Meningitis/encephalitis panel was negative.   HIV, hCG, ENA, ACE were negative or noncontributory. Anti-NMO was negative.  Anti-MOG is negative.   ANCA negative  REVIEW OF SYSTEMS: Constitutional: No fevers, chills, sweats, or change in appetite Eyes: No visual changes, double vision, eye pain Ear, nose and throat: No hearing loss, ear pain, nasal congestion, sore throat Cardiovascular: No chest pain, palpitations Respiratory:  No shortness of breath at rest or with  exertion.   No wheezes GastrointestinaI: No nausea, vomiting, diarrhea, abdominal pain, fecal incontinence Genitourinary:  No dysuria, urinary retention or frequency.  No nocturia. Musculoskeletal:  No neck pain, back pain Integumentary: No rash, pruritus, skin lesions Neurological: as above Psychiatric: No depression at this time.  No anxiety Endocrine: No palpitations, diaphoresis, change in appetite, change in weigh or increased thirst Hematologic/Lymphatic:  No anemia, purpura, petechiae. Allergic/Immunologic: No itchy/runny eyes, nasal congestion, recent allergic reactions, rashes  ALLERGIES: No Known Allergies  HOME MEDICATIONS:  Current Outpatient Medications:    COPAXONE 40 MG/ML SOSY, Inject into the skin., Disp: , Rfl:    escitalopram (LEXAPRO) 10 MG tablet, Take 1 tablet (10 mg total) by mouth daily., Disp: 90 tablet, Rfl: 3   meloxicam (MOBIC) 7.5 MG tablet, Take 1 tablet (7.5 mg total) by mouth daily., Disp: 30 tablet, Rfl: 11   phentermine (ADIPEX-P) 37.5 MG tablet, Take 37.5 mg by mouth every morning. (Patient not taking: Reported on 12/24/2023), Disp: , Rfl:   PAST MEDICAL HISTORY: Past Medical History:  Diagnosis Date   MS (multiple sclerosis) (HCC)    Scoliosis     PAST SURGICAL HISTORY: Past Surgical History:  Procedure Laterality Date   NO PAST SURGERIES      FAMILY HISTORY: Family History  Problem Relation Age of Onset   Anemia Mother    Diabetes Mother    Mental illness Mother    ADD / ADHD Sister    Anemia Maternal Grandmother    Diabetes Maternal Great-grandmother    Cancer Other        cancers on fathers side-type unknown   Multiple sclerosis Neg Hx     SOCIAL HISTORY: Social History   Socioeconomic History   Marital status: Single    Spouse name: Not on file   Number of children: 0   Years of education: Not on file   Highest education level: Not on file  Occupational History   Not on file  Tobacco Use   Smoking status: Never    Smokeless tobacco: Never  Vaping Use   Vaping status: Never Used  Substance and Sexual Activity   Alcohol use: Never   Drug use: Never   Sexual activity: Never    Birth control/protection: Abstinence  Other Topics Concern   Not on file  Social History Narrative   Right Handed   Pt lives with family    Pt doesn't work    Social Drivers of Corporate investment banker Strain: Low Risk  (11/09/2022)   Overall Financial Resource Strain (CARDIA)    Difficulty of Paying Living Expenses: Not very hard  Food Insecurity: No Food Insecurity (11/09/2022)   Hunger Vital Sign    Worried About Running Out of Food in the Last Year: Never true    Ran Out of Food in the Last Year: Never true  Transportation Needs:  No Transportation Needs (11/09/2022)   PRAPARE - Administrator, Civil Service (Medical): No    Lack of Transportation (Non-Medical): No  Physical Activity: Not on file  Stress: Not on file  Social Connections: Not on file  Intimate Partner Violence: Not At Risk (11/09/2022)   Humiliation, Afraid, Rape, and Kick questionnaire    Fear of Current or Ex-Partner: No    Emotionally Abused: No    Physically Abused: No    Sexually Abused: No       PHYSICAL EXAM  Vitals:   12/24/23 1533  BP: 109/68  Pulse: 85  Weight: 195 lb (88.5 kg)  Height: 5\' 5"  (1.651 m)     Body mass index is 32.45 kg/m.   General: The patient is well-developed and well-nourished and in no acute distress   Skin: Extremities are without rash or  edema.   Neurologic Exam  Mental status: The patient is alert and oriented x 3 at the time of the examination. The patient has apparent normal recent and remote memory, with an apparently normal attention span and concentration ability.   Speech is normal.  Cranial nerves: Extraocular movements are full.    Facial strength and sensation was normal.  No dysarthria.. No obvious hearing deficits are noted.  Motor:  Muscle bulk is normal.   Tone is  normal. Strength is  5 / 5 in all 4 extremities.   Sensory: Sensory testing is intact to pinprick, soft touch and vibration sensation in all 4 extremities (+/- mild vibration sensation asymmetry at the knees).  Coordination: Cerebellar testing reveals good finger-nose-finger and heel-to-shin bilaterally.  Gait and station: Station is normal.   The gait and tandem gait are normal.. Romberg is negative.   Reflexes: Deep tendon reflexes are symmetric and normal bilaterally.     DIAGNOSTIC DATA (LABS, IMAGING, TESTING) - I reviewed patient records, labs, notes, testing and imaging myself where available.  Lab Results  Component Value Date   WBC 9.1 08/10/2023   HGB 11.6 (L) 08/10/2023   HCT 35.5 (L) 08/10/2023   MCV 82.4 08/10/2023   PLT 333 08/10/2023      Component Value Date/Time   NA 136 08/10/2023 1706   K 3.7 08/10/2023 1706   CL 104 08/10/2023 1706   CO2 25 08/10/2023 1706   GLUCOSE 94 08/10/2023 1706   BUN 14 08/10/2023 1706   CREATININE 0.72 08/10/2023 1706   CREATININE 0.65 01/23/2023 0000   CALCIUM 9.4 08/10/2023 1706   PROT 7.4 08/10/2023 1706   ALBUMIN 4.1 08/10/2023 1706   ALBUMIN 4.2 11/06/2022 1442   AST 20 08/10/2023 1706   ALT 22 08/10/2023 1706   ALKPHOS 43 08/10/2023 1706   BILITOT 0.7 08/10/2023 1706   GFRNONAA >60 08/10/2023 1706   Lab Results  Component Value Date   CHOL 188 (H) 01/23/2023   HDL 49 01/23/2023   LDLCALC 122 (H) 01/23/2023   TRIG 78 01/23/2023   CHOLHDL 3.8 01/23/2023   Lab Results  Component Value Date   HGBA1C 5.5 04/05/2022   Lab Results  Component Value Date   VITAMINB12 633 01/23/2023   Lab Results  Component Value Date   TSH 1.116 08/10/2023       ASSESSMENT AND PLAN  Multiple sclerosis (HCC) - Plan: MR BRAIN W WO CONTRAST  Polyarthralgia  Numbness  Chronic tension-type headache, not intractable   Continue glatiramer.   Check MRI brain in a couple months to determine if stable.  If significant  breakthrough activity, consider a different DMT.    If migraine worsens, consider restarting zonisamide. Advised to eat well and exercise.    Take OTC vit D Rtc 6 months  This visit is part of a comprehensive longitudinal care medical relationship regarding the patients primary diagnosis of MS and related concerns.  Traniece Boffa A. Godwin Lat, MD, [facility] 12/24/2023, 4:04 PM Certified in Neurology, Clinical Neurophysiology, Sleep Medicine and Neuroimaging  [facility] [address] [place], [place] [address] [phone]

## 2023-12-25 DIAGNOSIS — E559 Vitamin D deficiency, unspecified: Secondary | ICD-10-CM | POA: Diagnosis not present

## 2023-12-25 DIAGNOSIS — Z6830 Body mass index (BMI) 30.0-30.9, adult: Secondary | ICD-10-CM | POA: Diagnosis not present

## 2023-12-25 DIAGNOSIS — Z131 Encounter for screening for diabetes mellitus: Secondary | ICD-10-CM | POA: Diagnosis not present

## 2023-12-25 DIAGNOSIS — Z13220 Encounter for screening for lipoid disorders: Secondary | ICD-10-CM | POA: Diagnosis not present

## 2023-12-25 DIAGNOSIS — E7439 Other disorders of intestinal carbohydrate absorption: Secondary | ICD-10-CM | POA: Diagnosis not present

## 2023-12-25 DIAGNOSIS — Z7189 Other specified counseling: Secondary | ICD-10-CM | POA: Diagnosis not present

## 2023-12-25 DIAGNOSIS — M545 Low back pain, unspecified: Secondary | ICD-10-CM | POA: Diagnosis not present

## 2023-12-25 DIAGNOSIS — Z0000 Encounter for general adult medical examination without abnormal findings: Secondary | ICD-10-CM | POA: Diagnosis not present

## 2024-01-20 DIAGNOSIS — Z419 Encounter for procedure for purposes other than remedying health state, unspecified: Secondary | ICD-10-CM | POA: Diagnosis not present

## 2024-02-20 DIAGNOSIS — Z419 Encounter for procedure for purposes other than remedying health state, unspecified: Secondary | ICD-10-CM | POA: Diagnosis not present

## 2024-02-24 ENCOUNTER — Telehealth: Payer: Self-pay | Admitting: Neurology

## 2024-02-24 NOTE — Telephone Encounter (Signed)
 [employer]: [id number] exp. 02/24/24-04/24/24 sent to GI [phone]

## 2024-02-27 ENCOUNTER — Encounter: Payer: Self-pay | Admitting: Neurology

## 2024-03-02 ENCOUNTER — Other Ambulatory Visit: Payer: Self-pay

## 2024-03-02 ENCOUNTER — Emergency Department (HOSPITAL_BASED_OUTPATIENT_CLINIC_OR_DEPARTMENT_OTHER): Admitting: Radiology

## 2024-03-02 ENCOUNTER — Emergency Department (HOSPITAL_BASED_OUTPATIENT_CLINIC_OR_DEPARTMENT_OTHER)
Admission: EM | Admit: 2024-03-02 | Discharge: 2024-03-02 | Disposition: A | Discharge status: Home / Self Care | Attending: Emergency Medicine | Admitting: Emergency Medicine

## 2024-03-02 ENCOUNTER — Encounter (HOSPITAL_BASED_OUTPATIENT_CLINIC_OR_DEPARTMENT_OTHER): Payer: Self-pay

## 2024-03-02 DIAGNOSIS — G8929 Other chronic pain: Secondary | ICD-10-CM | POA: Insufficient documentation

## 2024-03-02 DIAGNOSIS — M25511 Pain in right shoulder: Secondary | ICD-10-CM | POA: Insufficient documentation

## 2024-03-02 NOTE — ED Triage Notes (Signed)
 Intermittent right shoulder pain. Pain since age 18. Denies current injury.

## 2024-03-02 NOTE — ED Notes (Signed)
 Patient transported to X-ray

## 2024-03-02 NOTE — Discharge Instructions (Addendum)
 The x-ray of your right shoulder did not show any fracture or dislocation.  No arthritis.  As discussed, please follow-up with one of the orthopedic providers listed below within the next 2 weeks for further evaluation and management.  Physical therapy can be very beneficial with helping musculoskeletal pain.  Can self refer or have orthopedics refer you to a physical therapist.  You may also benefit from a rheumatologic evaluation if orthopedics/Physical Therapy feels this is more joint related.   You may take up to 1000mg  of tylenol  every 6 hours as needed for pain.  Do not take more then 4g per day.  You may use up to 600mg  ibuprofen  every 6 hours as needed for pain.  Do not exceed 2.4g of ibuprofen  per day.  Please return to the emergency room if you have any other emergent concerns.

## 2024-03-02 NOTE — ED Provider Notes (Signed)
  EMERGENCY DEPARTMENT AT [facility] Provider Note   CSN: [id number] Arrival date & time: 03/02/24  9142     Patient presents with: Shoulder Pain   Linda Floyd is a 18 y.o. female with history of multiple sclerosis, presents with concern for right shoulder pain that has been ongoing for the past 3 years.  She states that she worked in [employer], and thinks she initially injured her shoulder when working there.  Pain worse after getting up in the morning.  Denies any difficulties with range of motion.  Denies any new injuries.  She does report some tingling sensation in her upper extremities, but reports this is an ongoing issue with her MS.  No new numbness or tingling, or weakness.    Shoulder Pain      Prior to Admission medications   Medication Sig Start Date End Date Taking? Authorizing Provider  COPAXONE 40 MG/ML SOSY Inject into the skin. 07/25/23   [provider]  phentermine (ADIPEX-P) 37.5 MG tablet Take 37.5 mg by mouth every morning. Patient not taking: Reported on 12/24/2023 07/06/23   [provider]    Allergies: Patient has no known allergies.    Review of Systems  Musculoskeletal:        Right shoulder pain    Updated Vital Signs BP 119/69   Pulse 89   Temp 98.2 F (36.8 C) (Oral)   Resp 15   Ht 5' 5.5 (1.664 m)   Wt 95.3 kg   LMP 02/26/2024 (Approximate)   SpO2 98%   BMI 34.41 kg/m   Physical Exam Vitals and nursing note reviewed.  Constitutional:      Appearance: Normal appearance.  HENT:     Head: Atraumatic.   Cardiovascular:     Rate and Rhythm: Normal rate.     Comments: 2+ radial pulse bilaterally Pulmonary:     Effort: Pulmonary effort is normal.   Musculoskeletal:     Comments: Right upper extremity:  General No obvious deformity. No erythema, edema, contusions, open wounds   Palpation Mild tenderness palpation of the soft tissues overlying the humeral head.  No tenderness palpation of  the muscles overlying the posterior scapula Non-tender to palpation along the clavicle, AC joint, glenohumeral joint, humeral head, or distal humerus. Non-tender to palpation over the scapular ridge   ROM Full abduction, adduction, flexion, extension, internal, external rotation  Special tests No drop arm sign No pain with empty can testing  Sensation: Sensation intact throughout the upper extremity  Strength: 5/5 strength with supraspinatus, infraspinatus, subscapularis testing     Neurological:     General: No focal deficit present.     Mental Status: She is alert.     Comments: Intact sensation in the bilateral upper extremities  Psychiatric:        Mood and Affect: Mood normal.        Behavior: Behavior normal.     (all labs ordered are listed, but only abnormal results are displayed) Labs Reviewed - No data to display  EKG: None  Radiology: DG Shoulder Right Result Date: 03/02/2024 CLINICAL DATA:  Shoulder pain. EXAM: RIGHT SHOULDER - 2+ VIEW COMPARISON:  None Available. FINDINGS: No acute fracture or dislocation. No aggressive osseous lesion. Glenohumeral and acromioclavicular joints are normal in alignment. No significant arthritis. No soft tissue swelling. No radiopaque foreign bodies. IMPRESSION: *No acute osseous abnormality of the right shoulder. Electronically Signed   By: Ree Molt M.D.   On: 03/02/2024 10:11  Procedures   Medications Ordered in the ED - No data to display                                  Medical Decision Making Amount and/or Complexity of Data Reviewed Radiology: ordered.     Differential diagnosis includes but is not limited to fracture, dislocation, strain, frozen shoulder, osteoarthritis, rheumatoid arthritis, polymyalgia rheumatica, septic rhinitis   ED Course:  Upon initial evaluation, patient is well-appearing, stable vitals.  Reporting chronic right shoulder pain for the past 3 years.  She states it worsens  overnight when sleeping.  She has full range of motion of the shoulders bilaterally.  Neurovascularly intact in the bilateral upper extremities.  5/5 strength with supraspinatus, infraspinatus, and subscapularis testing.  No drop arm sign.  No overlying skin changes shows erythema, wounds, or edema to suggest infectious etiology such as cellulitis or septic arthritis. Low concern for gout. X-ray right shoulder negative for any acute fracture or dislocation.  No arthritis.  Low concern for emergent etiology at this time.  Feel she is stable and appropriate for discharge home  Imaging Studies ordered: I ordered imaging studies including x-ray right shoulder   I independently visualized the imaging with scope of interpretation limited to determining acute life threatening conditions related to emergency care. Imaging showed No acute abnormalities I agree with the radiologist interpretation   Impression: Chronic right shoulder pain  Disposition:  The patient was discharged home with instructions to follow-up with orthopedics and physical therapy within the next 2 weeks for further evaluation.  We also discussed she may benefit from rheumatologic evaluation if orthopedics/physical therapy does not feel like this is musculoskeletal in nature.  Tylenol  and ibuprofen  as needed for pain.  She declines muscle relaxer at this time. Return precautions given.    Record Review: External records from outside source obtained and reviewed including neurology notes for MS where she has been noted to have numbness and tingling in the arms and legs.     This chart was dictated using voice recognition software, Dragon. Despite the best efforts of this provider to proofread and correct errors, errors may still occur which can change documentation meaning.       Final diagnoses:  Chronic right shoulder pain    ED Discharge Orders     None          Veta Palma, PA-C 03/02/24 1042     Mannie Pac T, DO 03/04/24 [phone]

## 2024-03-09 DIAGNOSIS — Z6834 Body mass index (BMI) 34.0-34.9, adult: Secondary | ICD-10-CM | POA: Diagnosis not present

## 2024-03-09 DIAGNOSIS — G379 Demyelinating disease of central nervous system, unspecified: Secondary | ICD-10-CM | POA: Diagnosis not present

## 2024-03-09 DIAGNOSIS — Z7189 Other specified counseling: Secondary | ICD-10-CM | POA: Diagnosis not present

## 2024-03-21 DIAGNOSIS — Z419 Encounter for procedure for purposes other than remedying health state, unspecified: Secondary | ICD-10-CM | POA: Diagnosis not present

## 2024-03-22 ENCOUNTER — Ambulatory Visit
Admission: RE | Admit: 2024-03-22 | Discharge: 2024-03-22 | Disposition: A | Discharge status: Home / Self Care | Source: Ambulatory Visit | Attending: Neurology | Admitting: Neurology

## 2024-03-22 DIAGNOSIS — G35 Multiple sclerosis: Secondary | ICD-10-CM

## 2024-03-22 MED ORDER — GADOPICLENOL 0.5 MMOL/ML IV SOLN
9.0000 mL | Freq: Once | INTRAVENOUS | Status: AC | PRN
  Administered 2024-03-22: 9 mL via INTRAVENOUS

## 2024-03-23 ENCOUNTER — Ambulatory Visit: Payer: Self-pay | Admitting: Neurology

## 2024-03-23 NOTE — Telephone Encounter (Signed)
 I spoke to Linda Floyd about the MRI results.  Compared to September 2024, there is 1 new T2/FLAIR hyperintense focus (right frontal periventricular).  It did not enhance.  She started Copaxone around October/November 2024.  It is possible that that 1 focus could have occurred between the September MRI and November so it is uncertain if this represents breakthrough activity with just activity that occurred before she started her initial disease modifying therapy.  She is reluctant to try a different medication at this time.  A reasonable course of action will be to recheck an MRI in 9-12 months.  If there is additional breakthrough activity, then I would definitely recommend that she consider different disease modifying therapy.

## 2024-03-31 ENCOUNTER — Encounter: Payer: Self-pay | Admitting: Neurology

## 2024-03-31 ENCOUNTER — Ambulatory Visit: Payer: Medicaid Other | Admitting: Neurology

## 2024-04-06 ENCOUNTER — Ambulatory Visit: Admitting: Dermatology

## 2024-04-21 DIAGNOSIS — Z419 Encounter for procedure for purposes other than remedying health state, unspecified: Secondary | ICD-10-CM | POA: Diagnosis not present

## 2024-05-13 ENCOUNTER — Ambulatory Visit: Admitting: Dermatology

## 2024-05-22 DIAGNOSIS — Z419 Encounter for procedure for purposes other than remedying health state, unspecified: Secondary | ICD-10-CM | POA: Diagnosis not present

## 2024-05-29 ENCOUNTER — Emergency Department (HOSPITAL_BASED_OUTPATIENT_CLINIC_OR_DEPARTMENT_OTHER)

## 2024-05-29 ENCOUNTER — Emergency Department (HOSPITAL_BASED_OUTPATIENT_CLINIC_OR_DEPARTMENT_OTHER)
Admission: EM | Admit: 2024-05-29 | Discharge: 2024-05-29 | Disposition: A | Discharge status: Home / Self Care | Attending: Emergency Medicine | Admitting: Emergency Medicine

## 2024-05-29 ENCOUNTER — Encounter (HOSPITAL_BASED_OUTPATIENT_CLINIC_OR_DEPARTMENT_OTHER): Payer: Self-pay

## 2024-05-29 ENCOUNTER — Other Ambulatory Visit: Payer: Self-pay

## 2024-05-29 DIAGNOSIS — R0789 Other chest pain: Secondary | ICD-10-CM | POA: Insufficient documentation

## 2024-05-29 DIAGNOSIS — R1013 Epigastric pain: Secondary | ICD-10-CM | POA: Diagnosis not present

## 2024-05-29 DIAGNOSIS — R079 Chest pain, unspecified: Secondary | ICD-10-CM | POA: Diagnosis present

## 2024-05-29 LAB — URINALYSIS, ROUTINE W REFLEX MICROSCOPIC
Bilirubin Urine: NEGATIVE
Glucose, UA: NEGATIVE mg/dL
Hgb urine dipstick: NEGATIVE
Ketones, ur: NEGATIVE mg/dL
Leukocytes,Ua: NEGATIVE
Nitrite: NEGATIVE
Protein, ur: NEGATIVE mg/dL
Specific Gravity, Urine: 1.023 (ref 1.005–1.030)
pH: 6.5 (ref 5.0–8.0)

## 2024-05-29 LAB — COMPREHENSIVE METABOLIC PANEL WITH GFR
ALT: 9 U/L (ref 0–44)
AST: 17 U/L (ref 15–41)
Albumin: 4.2 g/dL (ref 3.5–5.0)
Alkaline Phosphatase: 52 U/L (ref 38–126)
Anion gap: 11 (ref 5–15)
BUN: 13 mg/dL (ref 6–20)
CO2: 22 mmol/L (ref 22–32)
Calcium: 9.2 mg/dL (ref 8.9–10.3)
Chloride: 101 mmol/L (ref 98–111)
Creatinine, Ser: 0.61 mg/dL (ref 0.44–1.00)
GFR, Estimated: >60 mL/min (ref >60)
Glucose, Bld: 87 mg/dL (ref 70–99)
Potassium: 4.3 mmol/L (ref 3.5–5.1)
Sodium: 134 mmol/L — ABNORMAL LOW (ref 135–145)
Total Bilirubin: 0.3 mg/dL (ref 0.0–1.2)
Total Protein: 7 g/dL (ref 6.5–8.1)

## 2024-05-29 LAB — CBC
HCT: 34.2 % — ABNORMAL LOW (ref 36.0–46.0)
Hemoglobin: 11.4 g/dL — ABNORMAL LOW (ref 12.0–15.0)
MCH: 27.9 pg (ref 26.0–34.0)
MCHC: 33.3 g/dL (ref 30.0–36.0)
MCV: 83.6 fL (ref 80.0–100.0)
Platelets: 300 K/uL (ref 150–400)
RBC: 4.09 MIL/uL (ref 3.87–5.11)
RDW: 13.7 % (ref 11.5–15.5)
WBC: 7.1 K/uL (ref 4.0–10.5)
nRBC: 0 % (ref 0.0–0.2)

## 2024-05-29 LAB — RESP PANEL BY RT-PCR (RSV, FLU A&B, COVID)  RVPGX2
Influenza A by PCR: NEGATIVE
Influenza B by PCR: NEGATIVE
Resp Syncytial Virus by PCR: NEGATIVE
SARS Coronavirus 2 by RT PCR: NEGATIVE

## 2024-05-29 LAB — D-DIMER, QUANTITATIVE: D-Dimer, Quant: 0.39 ug{FEU}/mL (ref 0.00–0.50)

## 2024-05-29 LAB — TROPONIN T, HIGH SENSITIVITY
Troponin T High Sensitivity: <15 ng/L (ref 0–19)
Troponin T High Sensitivity: <15 ng/L (ref 0–19)

## 2024-05-29 LAB — LIPASE, BLOOD: Lipase: 25 U/L (ref 11–51)

## 2024-05-29 LAB — PREGNANCY, URINE: Preg Test, Ur: NEGATIVE

## 2024-05-29 MED ORDER — DICYCLOMINE HCL 10 MG PO CAPS
10.0000 mg | ORAL_CAPSULE | Freq: Once | ORAL | Status: AC
  Administered 2024-05-29: 10 mg via ORAL
  Filled 2024-05-29: qty 1

## 2024-05-29 MED ORDER — ALUM & MAG HYDROXIDE-SIMETH 200-200-20 MG/5ML PO SUSP
30.0000 mL | Freq: Once | ORAL | Status: AC
  Administered 2024-05-29: 30 mL via ORAL
  Filled 2024-05-29: qty 30

## 2024-05-29 NOTE — ED Provider Notes (Signed)
  EMERGENCY DEPARTMENT AT [facility] Provider Note   CSN: [id number] Arrival date & time: 05/29/24  9171     Patient presents with: Chest Pain   Linda Floyd is a 18 y.o. female.   HPI 18 year old female history of MS presents today complaining of burning and left chest and epigastric area.  She states this was present when she woke at around 7 AM and.  She describes it as a dull burning sensation.  She has not had similar symptoms in the past.  It was initially an 8 out of 10 and is currently a 7 out of 10.  She has not had this previously and did not do any interventions.  She drove herself to the ED.  She denies any fever, chills, cough, nausea, vomiting, diarrhea.  States she did have a sore throat during the night and had nasal congestion on the right.    Prior to Admission medications   Medication Sig Start Date End Date Taking? Authorizing Provider  COPAXONE 40 MG/ML SOSY Inject into the skin. 07/25/23   [provider]  phentermine (ADIPEX-P) 37.5 MG tablet Take 37.5 mg by mouth every morning. Patient not taking: Reported on 12/24/2023 07/06/23   [provider]    Allergies: Patient has no known allergies.    Review of Systems  Updated Vital Signs BP 108/69   Pulse 80   Temp 98 F (36.7 C) (Oral)   Resp 15   SpO2 99%   Physical Exam Vitals and nursing note reviewed.  Constitutional:      General: She is not in acute distress.    Appearance: She is well-developed.  HENT:     Head: Normocephalic and atraumatic.     Right Ear: External ear normal.     Left Ear: External ear normal.     Nose: Nose normal.  Eyes:     Conjunctiva/sclera: Conjunctivae normal.     Pupils: Pupils are equal, round, and reactive to light.  Cardiovascular:     Rate and Rhythm: Normal rate and regular rhythm.     Heart sounds: Normal heart sounds.  Pulmonary:     Effort: Pulmonary effort is normal.     Breath sounds: Normal breath sounds.      Comments: Some anterior chest tenderness to palpation reproducing symptoms Abdominal:     General: Bowel sounds are normal.     Palpations: Abdomen is soft.     Comments: No epigastric pain or right upper quadrant pain  Musculoskeletal:        General: Normal range of motion.     Cervical back: Normal range of motion and neck supple.  Skin:    General: Skin is warm and dry.  Neurological:     Mental Status: She is alert and oriented to person, place, and time.     Motor: No abnormal muscle tone.     Coordination: Coordination normal.  Psychiatric:        Behavior: Behavior normal.        Thought Content: Thought content normal.     (all labs ordered are listed, but only abnormal results are displayed) Labs Reviewed  CBC - Abnormal; Notable for the following components:      Result Value   Hemoglobin 11.4 (*)    HCT 34.2 (*)    All other components within normal limits  COMPREHENSIVE METABOLIC PANEL WITH GFR - Abnormal; Notable for the following components:   Sodium 134 (*)    All  other components within normal limits  RESP PANEL BY RT-PCR (RSV, FLU A&B, COVID)  RVPGX2  LIPASE, BLOOD  URINALYSIS, ROUTINE W REFLEX MICROSCOPIC  PREGNANCY, URINE  D-DIMER, QUANTITATIVE  TROPONIN T, HIGH SENSITIVITY  TROPONIN T, HIGH SENSITIVITY    EKG: EKG Interpretation Date/Time:  Friday May 29 2024 08:39:00 EDT Ventricular Rate:  97 PR Interval:  145 QRS Duration:  96 QT Interval:  334 QTC Calculation: 425 R Axis:   62  Text Interpretation: Sinus rhythm Confirmed by Levander Houston [phone]) on 05/29/2024 12:09:11 PM  Radiology: [facility] Chest Port 1 View Result Date: 05/29/2024 CLINICAL DATA:  Chest pain EXAM: PORTABLE CHEST 1 VIEW COMPARISON:  None Available. FINDINGS: The heart size and mediastinal contours are within normal limits. Both lungs are clear. The visualized skeletal structures are unremarkable. IMPRESSION: No active disease. Electronically Signed   By: Maude Naegeli M.D.    On: 05/29/2024 09:30     Procedures   Medications Ordered in the ED  alum & mag hydroxide-simeth (MAALOX/MYLANTA) 200-200-20 MG/5ML suspension 30 mL (30 mLs Oral Given 05/29/24 0902)  dicyclomine  (BENTYL ) capsule 10 mg (10 mg Oral Given 05/29/24 0902)    Clinical Course as of 05/29/24 1210  Fri May 29, 2024  1034 X-Jobin Montelongo reviewed interpreted and within normal limits troponin reviewed interpreted within normal limits [DR]  1206 Troponin and repeat troponin are within normal limits [DR]    Clinical Course User Index [DR] Levander Houston, MD                                 Medical Decision Making Amount and/or Complexity of Data Reviewed Labs: ordered. Radiology: ordered.  Risk OTC drugs. Prescription drug management.  Patient seen and evaluated for chest pain.  Differential diagnosis of serious/life threatening causes of chest pain includes ACS, other diseases of the heart such as myocarditis or pericarditis, lung etiologies such as infection or pneumothorax, diseases of the great vessels such as aortic dissection or AAA, pulmonary embolism, or GI sources such as cholecystitis or other upper abdominal causes. Doubt ACS- heart score documented, EKG reviewed, Given the timing of pain to ER presentation, delta troponin was obtained and normal* so doubt NSTEMI troponin and repeat troponin obtained and WNL Doubt myocarditis/pericarditis/tamponade based on history, review of ekg and labs Doubt aortic dissection based on history and review of imaging Doubt intrinsic lung causes such as pneumonia or pneumothorax, based on history, physical exam, and studies obtained. Doubt PE based on history, physical exam, and PERC Doubt acute GI etiology requiring intervention based on history, physical exam and labs. Patient appears stable for discharge. Return precautions and need for follow up discussed and patient voices understanding      Final diagnoses:  Chest pain, unspecified type    ED  Discharge Orders     None          Levander Houston, MD 05/29/24 1210

## 2024-05-29 NOTE — Discharge Instructions (Addendum)
 Please recheck with your doctor in the next 1 to 4 days. Return if you are having any new or worsening pain, or other new symptoms such as shortness of breath, fever, chills, leg swelling, or other new symptoms

## 2024-05-29 NOTE — ED Triage Notes (Signed)
 Patient states dull pain to left side of chest that began this morning. Hx of MS. Denies n/v. Also states swelling to right side of inside of mouth that she noticed this morning.

## 2024-06-29 ENCOUNTER — Telehealth: Payer: Self-pay | Admitting: Neurology

## 2024-06-29 ENCOUNTER — Other Ambulatory Visit (HOSPITAL_COMMUNITY): Payer: Self-pay

## 2024-06-29 NOTE — Telephone Encounter (Signed)
 [facility] ([place]) Need PA for COPAXONE 40 MG/ML SOSY. Fax to: [phone]

## 2024-06-29 NOTE — Telephone Encounter (Signed)
 Pharmacy Patient Advocate Encounter  Received notification from [facility] MEDICAID that Prior Authorization for Copaxone 40mg /ml has been APPROVED from 06/29/2024 to 06/29/2025. Unable to obtain price due to refill too soon rejection, last fill date 06/29/2024 next available fill date11/05/2024   PA #/Case ID/Reference #: [id number]

## 2024-06-29 NOTE — Telephone Encounter (Signed)
 Pt should be on Copaxone. Directions: 40mg /ml SQ 3 times weekly. Qty 12ml/30 days or 61ml/90days

## 2024-06-29 NOTE — Telephone Encounter (Signed)
 Can you provide Qty and sig (Directions) please-looks like there is just an old order in chart missing QTY, and sig. Thank you!

## 2024-06-29 NOTE — Telephone Encounter (Signed)
 Pharmacy Patient Advocate Encounter   Received notification from Physician's Office that prior authorization for Copaxone Brand is required/requested.   Insurance verification completed.   The patient is insured through [facility] MEDICAID.   Per test claim: PA required; PA submitted to above mentioned insurance via Latent Key/confirmation #/EOC [id number] Status is pending

## 2024-07-10 ENCOUNTER — Other Ambulatory Visit: Payer: Self-pay | Admitting: Neurology

## 2024-07-29 ENCOUNTER — Encounter: Payer: Self-pay | Admitting: Neurology

## 2024-07-29 ENCOUNTER — Ambulatory Visit: Admitting: Neurology

## 2024-07-29 VITALS — BP 102/72 | HR 94 | Wt 235.0 lb

## 2024-07-29 DIAGNOSIS — R5383 Other fatigue: Secondary | ICD-10-CM | POA: Diagnosis not present

## 2024-07-29 DIAGNOSIS — G35A Relapsing-remitting multiple sclerosis: Secondary | ICD-10-CM | POA: Diagnosis not present

## 2024-07-29 DIAGNOSIS — R200 Anesthesia of skin: Secondary | ICD-10-CM | POA: Diagnosis not present

## 2024-07-29 DIAGNOSIS — R202 Paresthesia of skin: Secondary | ICD-10-CM

## 2024-07-29 MED ORDER — SERTRALINE HCL 50 MG PO TABS: 50.0000 mg | ORAL_TABLET | Freq: Every day | ORAL | 3 refills | Status: AC

## 2024-07-29 MED ORDER — ARMODAFINIL 250 MG PO TABS: 250.0000 mg | ORAL_TABLET | Freq: Every morning | ORAL | 5 refills | Status: DC

## 2024-07-29 NOTE — Progress Notes (Signed)
 [facility]  PATIENT: Linda Floyd DOB: [birth year]  REFERRING DOCTOR OR PCP: Elida Ross, MD SOURCE: Patient, notes from recent hospitalization, imaging and lab reports, MRI images personally reviewed.  _________________________________   HISTORICAL  CHIEF COMPLAINT:  Chief Complaint  Patient presents with   RM11/MS    Pt is here Alone. Pt states that she has numbness all the time including in her feet. Pt states that she has some brain fog. Pt states she has muscle fatigue.     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  UPDATE 07/29/2024: She started glatiramer around 06/2023.  She is tolerating it well systemically but has skin reactions.   She notes no definite exacerbation but has some fluctuating symptoms - a vibrating feeling and mild cognitive issues/poor focus.   She feels anxiety and depression are worse.   She notes some fluctuating visual changes    She had MRI 03/2024.  Compared to September 2024, there is 1 new T2/FLAIR hyperintense focus (right frontal periventricular).  It did not enhance.  She started Copaxone around October/November 2024.  It is possible that that 1 focus could have occurred between the September MRI and November so it is uncertain if this represents breakthrough activity with just activity that occurred before she started her initial disease modifying therapy.   We had dicussed , a reasonable course of action will be to recheck an MRI in 9-12 months.  If there is additional breakthrough activity, then I would definitely recommend that she consider different disease modifying therapy.  She denies any major new neurologic symptom.  Gait and balance are fine.  She can go up and down the stairs without the banister. She sometimes goes to a gym.  Bowel function is fine.  She has urinary urgency but no incontinence  Vision is fine.    She reports some musculoskeletal pain.  Xray of shouldrs and pelvis looked  good.   ENA was negative in February 2024.  However, ANA was low positive in May 2024 (both a cytoplasmic and nuclear pattern, both 1: 40).  ESR was mildly elevated.  Since the diagnosis, she has noted quite a bit more anxiety.  She denies actual depression..   .    She continues to experience headaches with some migrainous characteristics.  These are not associated with N/V.  She sometimes has mild photophobia/phonophobia.   Movements intensify the pain.   Excedrin Migraine has not helped.    She tried zonisamide  and is may have helped some but did not eliminate them. .  History of demyelinating event She had the onset of a left leg pulsating sensation while in bed.  When she woke up the left calf and foot were numb.    She wet to the ED and had imaging studies showing a 17 mm focus in the periventricular white matter on the left with some enhancement.    She was placed on IV steroids and initially symptoms worsened - moving to the right as well from the calf down and from the thigh down on the left.   She also has had more muscle spasms in her back.   A few days after discharge, early March 2024, her symptoms resolved.      She has no definite optic neuritis though noted some blurry vision for a few hours.     She noted some clumsiness at work for a few days a week after discharge.  CSF did not show OCB.  However, MRI brain 06/09/2023 showed resolution of the enhancing lesion seen 11/04/2022 and 3 additioanl non-enhancing lesions --- c/w RRMS.  Glatiramer was started as risk averse.   MRI 03/2024 showed one new small lesion (unclearif would have occurred before or after glatiramer).  .  Imaging: MRI of the brain 11/04/2022 shows a single periventricular focus in the left frontal lobe measuring 17 mm in longest diameter (AP).  It had heterogenous enhancement.  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  MRI of the cervical and thoracic spine 11/05/2022 showed no evidence of demyelinating  disease in the spinal cord.  Prominent lymph nodes were noted left greater than right  MRI lumbar spine 11/04/2022 was normal  The MRI of the brain performed 06/09/2023 showed resolution of the enhancement associated with the left frontal focus seen on the previous MRI.  However, in the interim (since 11/04/2022) there are 3 additional foci in the hemispheres.  These are small and would probably not have cause any symptoms.      MRI 03/2024.  Compared to September 2024, there is 1 new T2/FLAIR hyperintense focus (right frontal periventricular).  It did not enhance.  She started Copaxone around October/November 2024.  It is possible that that 1 focus could have occurred between the September MRI and November so it is uncertain if this represents breakthrough activity with just activity that occurred before she started her initial disease modifying therapy.      Laboratory: February 2024: CSF showed elevated IgG index but there was 0 oligoclonal bands.  Meningitis/encephalitis panel was negative.   HIV, hCG, ENA, ACE were negative or noncontributory. Anti-NMO was negative.  Anti-MOG is negative.   ANCA negative  REVIEW OF SYSTEMS: Constitutional: No fevers, chills, sweats, or change in appetite Eyes: No visual changes, double vision, eye pain Ear, nose and throat: No hearing loss, ear pain, nasal congestion, sore throat Cardiovascular: No chest pain, palpitations Respiratory:  No shortness of breath at rest or with exertion.   No wheezes GastrointestinaI: No nausea, vomiting, diarrhea, abdominal pain, fecal incontinence Genitourinary:  No dysuria, urinary retention or frequency.  No nocturia. Musculoskeletal:  No neck pain, back pain Integumentary: No rash, pruritus, skin lesions Neurological: as above Psychiatric: No depression at this time.  No anxiety Endocrine: No palpitations, diaphoresis, change in appetite, change in weigh or increased thirst Hematologic/Lymphatic:  No anemia, purpura,  petechiae. Allergic/Immunologic: No itchy/runny eyes, nasal congestion, recent allergic reactions, rashes  ALLERGIES: No Known Allergies  HOME MEDICATIONS:  Current Outpatient Medications:    COPAXONE 40 MG/ML SOSY, INJECT 40 MG UNDER THE SKIN THREE TIMES WEEKLY, Disp: 12 mL, Rfl: 11   phentermine (ADIPEX-P) 37.5 MG tablet, Take 37.5 mg by mouth every morning. (Patient not taking: Reported on 12/24/2023), Disp: , Rfl:   PAST MEDICAL HISTORY: Past Medical History:  Diagnosis Date   MS (multiple sclerosis)    Scoliosis     PAST SURGICAL HISTORY: Past Surgical History:  Procedure Laterality Date   NO PAST SURGERIES      FAMILY HISTORY: Family History  Problem Relation Age of Onset   Anemia Mother    Diabetes Mother    Mental illness Mother    ADD / ADHD Sister    Anemia Maternal Grandmother    Diabetes Maternal Great-grandmother    Cancer Other        cancers on fathers side-type unknown   Multiple sclerosis Neg Hx  SOCIAL HISTORY: Social History   Socioeconomic History   Marital status: Single    Spouse name: Not on file   Number of children: 0   Years of education: Not on file   Highest education level: Not on file  Occupational History   Not on file  Tobacco Use   Smoking status: Never   Smokeless tobacco: Never  Vaping Use   Vaping status: Never Used  Substance and Sexual Activity   Alcohol use: Never   Drug use: Never   Sexual activity: Never    Birth control/protection: Abstinence  Other Topics Concern   Not on file  Social History Narrative   Right Handed   Pt lives with family    Pt doesn't work    Social Drivers of Corporate Investment Banker Strain: Low Risk  (11/09/2022)   Overall Financial Resource Strain (CARDIA)    Difficulty of Paying Living Expenses: Not very hard  Food Insecurity: No Food Insecurity (11/09/2022)   Hunger Vital Sign    Worried About Running Out of Food in the Last Year: Never true    Ran Out of Food in the Last  Year: Never true  Transportation Needs: No Transportation Needs (11/09/2022)   PRAPARE - Administrator, Civil Service (Medical): No    Lack of Transportation (Non-Medical): No  Physical Activity: Not on file  Stress: Not on file  Social Connections: Not on file  Intimate Partner Violence: Not At Risk (11/09/2022)   Humiliation, Afraid, Rape, and Kick questionnaire    Fear of Current or Ex-Partner: No    Emotionally Abused: No    Physically Abused: No    Sexually Abused: No       PHYSICAL EXAM  Vitals:   07/29/24 1536  BP: 102/72  Pulse: 94  SpO2: 99%  Weight: 235 lb (106.6 kg)     Body mass index is 38.51 kg/m.   General: The patient is well-developed and well-nourished and in no acute distress   Skin: Extremities are without rash or  edema.   Neurologic Exam  Mental status: The patient is alert and oriented x 3 at the time of the examination. The patient has apparent normal recent and remote memory, with an apparently normal attention span and concentration ability.   Speech is normal.  Cranial nerves: Extraocular movements are full.    Facial strength and sensation was normal.  No dysarthria.. No obvious hearing deficits are noted.  Motor:  Muscle bulk is normal.   Tone is normal. Strength is  5 / 5 in all 4 extremities.   Sensory: Sensory testing is intact to pinprick, soft touch and vibration sensation in all 4 extremities (+/- mild vibration sensation asymmetry at the knees).  Coordination: Cerebellar testing reveals good finger-nose-finger and heel-to-shin bilaterally.  Gait and station: Station is normal.  She has a normal gait and tandem walk.  No Romberg sign..   Reflexes: Deep tendon reflexes are symmetric and normal bilaterally.     DIAGNOSTIC DATA (LABS, IMAGING, TESTING) - I reviewed patient records, labs, notes, testing and imaging myself where available.  Lab Results  Component Value Date   WBC 7.1 05/29/2024   HGB 11.4 (L)  05/29/2024   HCT 34.2 (L) 05/29/2024   MCV 83.6 05/29/2024   PLT 300 05/29/2024      Component Value Date/Time   NA 134 (L) 05/29/2024 0854   K 4.3 05/29/2024 0854   CL 101 05/29/2024 0854   CO2 22  05/29/2024 0854   GLUCOSE 87 05/29/2024 0854   BUN 13 05/29/2024 0854   CREATININE 0.61 05/29/2024 0854   CREATININE 0.65 01/23/2023 0000   CALCIUM 9.2 05/29/2024 0854   PROT 7.0 05/29/2024 0854   ALBUMIN 4.2 05/29/2024 0854   ALBUMIN 4.2 11/06/2022 1442   AST 17 05/29/2024 0854   ALT 9 05/29/2024 0854   ALKPHOS 52 05/29/2024 0854   BILITOT 0.3 05/29/2024 0854   GFRNONAA >60 05/29/2024 0854   Lab Results  Component Value Date   CHOL 188 (H) 01/23/2023   HDL 49 01/23/2023   LDLCALC 122 (H) 01/23/2023   TRIG 78 01/23/2023   CHOLHDL 3.8 01/23/2023   Lab Results  Component Value Date   HGBA1C 5.5 04/05/2022   Lab Results  Component Value Date   VITAMINB12 633 01/23/2023   Lab Results  Component Value Date   TSH 1.116 08/10/2023       ASSESSMENT AND PLAN  Multiple sclerosis, relapsing-remitting  Numbness  Other fatigue  Paresthesia   Continue glatiramer.   Check MRI brain in a couple months to determine if stable.  If significant breakthrough activity, consider a different DMT.    For fatigue, trial of armodafinil .   Also sertraline  for depression/anxiety. Advised to eat well and exercise.    Take OTC vit D She asked about FMLA and will forward a form for intermittent leave.  Rtc 6 months  This visit is part of a comprehensive longitudinal care medical relationship regarding the patients primary diagnosis of MS and related concerns.  Shanecia Hoganson A. Vear, MD, [facility] 07/29/2024, 4:02 PM Certified in Neurology, Clinical Neurophysiology, Sleep Medicine and Neuroimaging  [facility] [address], [address] [place], [place] [address] [phone]

## 2024-08-11 ENCOUNTER — Other Ambulatory Visit: Payer: Self-pay | Admitting: Neurology

## 2024-08-11 ENCOUNTER — Telehealth: Payer: Self-pay

## 2024-08-11 ENCOUNTER — Other Ambulatory Visit (HOSPITAL_COMMUNITY): Payer: Self-pay

## 2024-08-11 MED ORDER — ARMODAFINIL 250 MG PO TABS
250.0000 mg | ORAL_TABLET | Freq: Every morning | ORAL | 5 refills | Status: DC
  Filled 2024-08-11: qty 30, 30d supply, fill #0

## 2024-08-11 MED ORDER — ARMODAFINIL 250 MG PO TABS: 250.0000 mg | ORAL_TABLET | Freq: Every morning | ORAL | 5 refills | Status: DC

## 2024-08-11 NOTE — Telephone Encounter (Signed)
 Pharmacy Patient Advocate Encounter   Received notification from Fax that prior authorization for Armodafinil  250mg  Tablet is required/requested.   Insurance verification completed.   The patient is insured through [facility] [facility] MEDICAID.   Per test claim: PA required; PA submitted to above mentioned insurance via Latent Key/confirmation #/EOC [id number] Status is pending

## 2024-08-11 NOTE — Telephone Encounter (Signed)
 Pharmacy Patient Advocate Encounter  Received notification from [facility] MEDICAID that Prior Authorization for Armodafinil  has been DENIED.  Full denial letter will be uploaded to the media tab. See denial reason below.    PA #/Case ID/Reference #: [id number]  Medicaid prefers BRAND but will still need a PA for [facility] as well.

## 2024-08-12 ENCOUNTER — Telehealth: Payer: Self-pay

## 2024-08-12 ENCOUNTER — Other Ambulatory Visit (HOSPITAL_COMMUNITY): Payer: Self-pay

## 2024-08-12 NOTE — Telephone Encounter (Signed)
 Pharmacy Patient Advocate Encounter   Received notification from Physician's Office that prior authorization for Nuvigil  is required/requested.   Insurance verification completed.   The patient is insured through [facility] MEDICAID.   Per test claim: PA required; PA started via CoverMyMeds. KEY [id number] . Waiting for clinical questions to populate.

## 2024-08-12 NOTE — Telephone Encounter (Signed)
 PA request has been Submitted. New Encounter has been or will be created for follow up. For additional info see Pharmacy Prior Auth telephone encounter from 08/12/2024.

## 2024-08-13 ENCOUNTER — Other Ambulatory Visit (HOSPITAL_COMMUNITY): Payer: Self-pay

## 2024-08-13 NOTE — Telephone Encounter (Signed)
 PA was cancelled by plan due to previous denial fo rgeneric, even tho this one was for brand. I will try to resubmit again today and if still denied or cancelled I will outreach plan to try to straighten out the confusion.  New [id number]  awaiting for clinical questions to populate.

## 2024-08-13 NOTE — Telephone Encounter (Signed)
 Called [facility] and they are going to fax me the PA form to submit for the [facility].

## 2024-08-13 NOTE — Telephone Encounter (Signed)
 Pharmacy Patient Advocate Encounter   Received notification from Physician's Office that prior authorization for Nuvigil  is required/requested.   Insurance verification completed.   The patient is insured through [facility] MEDICAID.   Per test claim: PA required; PA submitted to above mentioned insurance via Fax Key/confirmation #/EOC [id number] Status is pending

## 2024-08-14 ENCOUNTER — Other Ambulatory Visit: Payer: Self-pay | Admitting: Neurology

## 2024-08-14 ENCOUNTER — Other Ambulatory Visit (HOSPITAL_COMMUNITY): Payer: Self-pay

## 2024-08-14 MED ORDER — MODAFINIL 200 MG PO TABS: 200.0000 mg | ORAL_TABLET | Freq: Every day | ORAL | 5 refills | Status: AC

## 2024-08-14 MED ORDER — MODAFINIL 200 MG PO TABS
200.0000 mg | ORAL_TABLET | Freq: Every day | ORAL | 5 refills | Status: DC
  Filled 2024-08-14: qty 30, 30d supply, fill #0

## 2024-08-14 NOTE — Telephone Encounter (Signed)
 Pharmacy Patient Advocate Encounter   Received notification from Physician's Office that prior authorization for Provigil  200mg  Tablet is required/requested.   Insurance verification completed.   The patient is insured through [facility] MEDICAID.   Per test claim: PA required; PA started via CoverMyMeds. KEY [id number] . Waiting for clinical questions to populate.

## 2024-08-14 NOTE — Telephone Encounter (Signed)
 Pharmacy Patient Advocate Encounter  Received notification from [facility] MEDICAID that Prior Authorization for Nuvigil  has been DENIED.  Full denial letter will be uploaded to the media tab. See denial reason below.   PA #/Case ID/Reference #: [id number]  Even though both brands are listed on the preferred list for medicaid this particular plan requires PT to try Provigil  BRAND 100mg  or 200mg  first.

## 2024-08-14 NOTE — Telephone Encounter (Signed)
 Clinical questions have been answered and PA submitted. PA currently Pending. Please be advised that most companies allow up to 30 days to make a decision. We will advise when a determination has been made, or follow up in 1 week.   Please reach out to our team, Rx Prior Auth Pool, if you haven't heard back in a week.

## 2024-08-17 ENCOUNTER — Other Ambulatory Visit (HOSPITAL_COMMUNITY): Payer: Self-pay

## 2024-08-17 NOTE — Telephone Encounter (Signed)
 Pharmacy Patient Advocate Encounter  Received notification from [facility] MEDICAID that Prior Authorization for Provigil  200MG  tablets  has been APPROVED from 08/14/2024 to 08/14/2025. Unable to obtain price due to refill too soon rejection, last fill date 08/17/2024 next available fill date12/30/2025   PA #/Case ID/Reference #: [id number]

## 2024-08-26 NOTE — Telephone Encounter (Signed)
 PA has been submitted and documented in separate encounter, please sign off on rx in this encounter as PA team is unable to resolve RX requests. Thank you

## 2024-08-31 ENCOUNTER — Emergency Department (HOSPITAL_COMMUNITY)
Admission: EM | Admit: 2024-08-31 | Discharge: 2024-09-01 | Disposition: A | Discharge status: Home / Self Care | Attending: Emergency Medicine | Admitting: Emergency Medicine

## 2024-08-31 ENCOUNTER — Encounter (HOSPITAL_COMMUNITY): Payer: Self-pay

## 2024-08-31 ENCOUNTER — Other Ambulatory Visit: Payer: Self-pay

## 2024-08-31 DIAGNOSIS — N898 Other specified noninflammatory disorders of vagina: Secondary | ICD-10-CM | POA: Diagnosis present

## 2024-08-31 LAB — URINALYSIS, ROUTINE W REFLEX MICROSCOPIC
Bacteria, UA: NONE SEEN
Bilirubin Urine: NEGATIVE
Glucose, UA: NEGATIVE mg/dL
Ketones, ur: NEGATIVE mg/dL
Leukocytes,Ua: NEGATIVE
Nitrite: NEGATIVE
Protein, ur: NEGATIVE mg/dL
Specific Gravity, Urine: 1.021 (ref 1.005–1.030)
pH: 6 (ref 5.0–8.0)

## 2024-08-31 LAB — PREGNANCY, URINE: Preg Test, Ur: NEGATIVE

## 2024-08-31 NOTE — ED Provider Notes (Signed)
 " [place] EMERGENCY DEPARTMENT AT [facility] Provider Note   CSN: [id number] Arrival date & time: 08/31/24  2115     Patient presents with: No chief complaint on file.   Linda Floyd is a 18 y.o. female.  {Add pertinent medical, surgical, social history, OB history to HPI:1034} 18 year old female presents with concern for vaginal irritation onset 1-2 days ago. Patient is sexually active with a new female partner, reports oral intercourse and use of toys. Has also used a new shaving oil, unsure if related. No obvious discharge, is on menstrual cycle currently. Also concern for new lesions to labia.        Prior to Admission medications  Medication Sig Start Date End Date Taking? Authorizing Provider  COPAXONE 40 MG/ML SOSY INJECT 40 MG UNDER THE SKIN THREE TIMES WEEKLY 07/13/24   Sater, Charlie LABOR, MD  modafinil  (PROVIGIL ) 200 MG tablet Take 1 tablet (200 mg total) by mouth daily. 08/14/24   Sater, Charlie LABOR, MD  phentermine (ADIPEX-P) 37.5 MG tablet Take 37.5 mg by mouth every morning. Patient not taking: Reported on 12/24/2023 07/06/23   [provider]  sertraline  (ZOLOFT ) 50 MG tablet Take 1 tablet (50 mg total) by mouth daily. 07/29/24   Sater, Charlie LABOR, MD    Allergies: Patient has no known allergies.    Review of Systems Negative except as per HPI Updated Vital Signs BP 124/73 (BP Location: Left Arm)   Pulse 87   Temp 98.1 F (36.7 C) (Oral)   Resp 15   SpO2 100%   Physical Exam Vitals and nursing note reviewed. Exam conducted with a chaperone present.  Constitutional:      General: She is not in acute distress.    Appearance: She is well-developed. She is not diaphoretic.  HENT:     Head: Normocephalic and atraumatic.  Pulmonary:     Effort: Pulmonary effort is normal.  Genitourinary:    General: Normal vulva.     Pubic Area: No rash.      Vagina: No signs of injury and foreign body.     Cervix: Friability present. No discharge, lesion  or cervical bleeding.  Skin:    General: Skin is warm and dry.  Neurological:     Mental Status: She is alert and oriented to person, place, and time.  Psychiatric:        Behavior: Behavior normal.     (all labs ordered are listed, but only abnormal results are displayed) Labs Reviewed  URINALYSIS, ROUTINE W REFLEX MICROSCOPIC - Abnormal; Notable for the following components:      Result Value   APPearance HAZY (*)    Hgb urine dipstick SMALL (*)    All other components within normal limits  WET PREP, GENITAL  HSV CULTURE AND TYPING  PREGNANCY, URINE    EKG: None  Radiology: No results found.  {Document cardiac monitor, telemetry assessment procedure when appropriate:32947} Procedures   Medications Ordered in the ED - No data to display    {Click here for ABCD2, HEART and other calculators REFRESH Note before signing:1}                              Medical Decision Making Amount and/or Complexity of Data Reviewed Labs: ordered.   This patient presents to the ED for concern of vaginal irritation, this involves an extensive number of treatment options, and is a complaint that carries with  it a high risk of complications and morbidity.  The differential diagnosis includes BV, trich, yeast, herpes, contact allergy    Co morbidities / Chronic conditions that complicate the patient evaluation  MS, scoliosis    Additional history obtained:  Additional history obtained from EMR External records from outside source obtained and reviewed including no prior STI labs on file    Lab Tests:  I Ordered, and personally interpreted labs.  The pertinent results include:  ***   Problem List / ED Course / Critical interventions / Medication management  *** I ordered medication including ***   Reevaluation of the patient after these medicines showed that the patient *** I have reviewed the patients home medicines and have made adjustments as needed  Social Determinants  of Health:  Has PCP   Test / Admission - Considered:  Stable for dc   {Document critical care time when appropriate  Document review of labs and clinical decision tools ie CHADS2VASC2, etc  Document your independent review of radiology images and any outside records  Document your discussion with family members, caretakers and with consultants  Document social determinants of health affecting pt's care  Document your decision making why or why not admission, treatments were needed:32947:::1}   Final diagnoses:  None    ED Discharge Orders     None        "

## 2024-08-31 NOTE — ED Triage Notes (Signed)
 Pt. Arrives pov c/o vaginal itching. Denies burning but states that it feels hot. Symptoms started yesterday. States that she has had a new sex partner. States that she is also on her period so she is unable to identify any discharge. Denies any foul odor.

## 2024-08-31 NOTE — Discharge Instructions (Signed)
 Your test was negative for BV, trich, yeast today.   Recheck with your PCP or see gynecology if symptoms persist.  Follow-up in your MyChart in the next 3 to 5 days for send out test results.  Discontinue shave oil and see if symptoms resolve

## 2024-09-01 LAB — WET PREP, GENITAL
Clue Cells Wet Prep HPF POC: NONE SEEN
Sperm: NONE SEEN
Trich, Wet Prep: NONE SEEN
WBC, Wet Prep HPF POC: <10
Yeast Wet Prep HPF POC: NONE SEEN

## 2024-09-01 LAB — GC/CHLAMYDIA PROBE AMP (~~LOC~~) NOT AT ARMC
Chlamydia: NEGATIVE
Comment: NEGATIVE
Comment: NORMAL
Neisseria Gonorrhea: NEGATIVE

## 2024-09-06 ENCOUNTER — Emergency Department (HOSPITAL_COMMUNITY)
Admission: EM | Admit: 2024-09-06 | Discharge: 2024-09-06 | Discharge status: Against Medical Advice | Source: Home / Self Care

## 2024-12-17 ENCOUNTER — Ambulatory Visit: Admitting: Dermatology

## 2025-03-03 ENCOUNTER — Ambulatory Visit: Admitting: Neurology
